# Patient Record
Sex: Male | Born: 1985 | Race: White | Hispanic: No | Smoking: Current every day smoker
Health system: Southern US, Community
[De-identification: ages and names within clinical notes are randomized; demographics above are authoritative.]

## PROBLEM LIST (undated history)

## (undated) DIAGNOSIS — I10 Essential (primary) hypertension: Secondary | ICD-10-CM

## (undated) DIAGNOSIS — B019 Varicella without complication: Secondary | ICD-10-CM

## (undated) DIAGNOSIS — K259 Gastric ulcer, unspecified as acute or chronic, without hemorrhage or perforation: Secondary | ICD-10-CM

## (undated) HISTORY — DX: Gastric ulcer, unspecified as acute or chronic, without hemorrhage or perforation: K25.9

## (undated) HISTORY — DX: Essential (primary) hypertension: I10

## (undated) HISTORY — DX: Varicella without complication: B01.9

---
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
## 2006-06-23 ENCOUNTER — Emergency Department (HOSPITAL_COMMUNITY): Admission: EM | Admit: 2006-06-23 | Discharge: 2006-06-23 | Payer: Self-pay | Admitting: Emergency Medicine

## 2006-09-01 ENCOUNTER — Emergency Department (HOSPITAL_COMMUNITY): Admission: EM | Admit: 2006-09-01 | Discharge: 2006-09-01 | Payer: Self-pay | Admitting: Emergency Medicine

## 2006-09-03 ENCOUNTER — Emergency Department: Payer: Self-pay | Admitting: Emergency Medicine

## 2006-12-04 HISTORY — PX: APPENDECTOMY: SHX54

## 2006-12-13 ENCOUNTER — Emergency Department (HOSPITAL_COMMUNITY): Admission: EM | Admit: 2006-12-13 | Discharge: 2006-12-13 | Payer: Self-pay | Admitting: Emergency Medicine

## 2007-04-07 ENCOUNTER — Emergency Department (HOSPITAL_COMMUNITY): Admission: EM | Admit: 2007-04-07 | Discharge: 2007-04-07 | Payer: Self-pay | Admitting: Emergency Medicine

## 2007-04-25 ENCOUNTER — Emergency Department (HOSPITAL_COMMUNITY): Admission: EM | Admit: 2007-04-25 | Discharge: 2007-04-25 | Payer: Self-pay | Admitting: Emergency Medicine

## 2007-04-27 ENCOUNTER — Inpatient Hospital Stay (HOSPITAL_COMMUNITY): Admission: EM | Admit: 2007-04-27 | Discharge: 2007-05-01 | Payer: Self-pay | Admitting: Emergency Medicine

## 2007-04-27 ENCOUNTER — Encounter (INDEPENDENT_AMBULATORY_CARE_PROVIDER_SITE_OTHER): Payer: Self-pay | Admitting: Surgery

## 2007-06-19 ENCOUNTER — Emergency Department (HOSPITAL_COMMUNITY): Admission: EM | Admit: 2007-06-19 | Discharge: 2007-06-19 | Payer: Self-pay | Admitting: Emergency Medicine

## 2007-06-26 ENCOUNTER — Emergency Department (HOSPITAL_COMMUNITY): Admission: EM | Admit: 2007-06-26 | Discharge: 2007-06-26 | Payer: Self-pay | Admitting: Emergency Medicine

## 2007-06-27 ENCOUNTER — Emergency Department (HOSPITAL_COMMUNITY): Admission: EM | Admit: 2007-06-27 | Discharge: 2007-06-27 | Payer: Self-pay | Admitting: Emergency Medicine

## 2007-06-30 ENCOUNTER — Emergency Department (HOSPITAL_COMMUNITY): Admission: EM | Admit: 2007-06-30 | Discharge: 2007-06-30 | Payer: Self-pay | Admitting: Emergency Medicine

## 2007-07-07 ENCOUNTER — Emergency Department (HOSPITAL_COMMUNITY): Admission: EM | Admit: 2007-07-07 | Discharge: 2007-07-07 | Payer: Self-pay | Admitting: Emergency Medicine

## 2007-07-24 ENCOUNTER — Emergency Department (HOSPITAL_COMMUNITY): Admission: EM | Admit: 2007-07-24 | Discharge: 2007-07-25 | Payer: Self-pay | Admitting: Emergency Medicine

## 2007-08-31 ENCOUNTER — Emergency Department (HOSPITAL_COMMUNITY): Admission: EM | Admit: 2007-08-31 | Discharge: 2007-08-31 | Payer: Self-pay | Admitting: Emergency Medicine

## 2007-10-02 ENCOUNTER — Emergency Department (HOSPITAL_COMMUNITY): Admission: EM | Admit: 2007-10-02 | Discharge: 2007-10-02 | Payer: Self-pay | Admitting: Emergency Medicine

## 2007-12-05 HISTORY — PX: WISDOM TOOTH EXTRACTION: SHX21

## 2007-12-19 ENCOUNTER — Emergency Department (HOSPITAL_COMMUNITY): Admission: EM | Admit: 2007-12-19 | Discharge: 2007-12-19 | Payer: Self-pay | Admitting: Emergency Medicine

## 2008-02-16 ENCOUNTER — Emergency Department (HOSPITAL_COMMUNITY): Admission: EM | Admit: 2008-02-16 | Discharge: 2008-02-16 | Payer: Self-pay | Admitting: Emergency Medicine

## 2008-05-31 ENCOUNTER — Emergency Department (HOSPITAL_COMMUNITY): Admission: EM | Admit: 2008-05-31 | Discharge: 2008-05-31 | Payer: Self-pay | Admitting: Emergency Medicine

## 2008-06-07 ENCOUNTER — Emergency Department (HOSPITAL_COMMUNITY): Admission: EM | Admit: 2008-06-07 | Discharge: 2008-06-07 | Payer: Self-pay | Admitting: Emergency Medicine

## 2008-07-08 ENCOUNTER — Emergency Department (HOSPITAL_BASED_OUTPATIENT_CLINIC_OR_DEPARTMENT_OTHER): Admission: EM | Admit: 2008-07-08 | Discharge: 2008-07-08 | Payer: Self-pay | Admitting: Emergency Medicine

## 2008-07-12 ENCOUNTER — Emergency Department (HOSPITAL_BASED_OUTPATIENT_CLINIC_OR_DEPARTMENT_OTHER): Admission: EM | Admit: 2008-07-12 | Discharge: 2008-07-12 | Payer: Self-pay | Admitting: Emergency Medicine

## 2008-07-21 ENCOUNTER — Emergency Department (HOSPITAL_BASED_OUTPATIENT_CLINIC_OR_DEPARTMENT_OTHER): Admission: EM | Admit: 2008-07-21 | Discharge: 2008-07-21 | Payer: Self-pay | Admitting: Emergency Medicine

## 2008-07-26 ENCOUNTER — Emergency Department (HOSPITAL_BASED_OUTPATIENT_CLINIC_OR_DEPARTMENT_OTHER): Admission: EM | Admit: 2008-07-26 | Discharge: 2008-07-26 | Payer: Self-pay | Admitting: Emergency Medicine

## 2008-07-29 ENCOUNTER — Emergency Department (HOSPITAL_BASED_OUTPATIENT_CLINIC_OR_DEPARTMENT_OTHER): Admission: EM | Admit: 2008-07-29 | Discharge: 2008-07-29 | Payer: Self-pay | Admitting: Emergency Medicine

## 2008-08-03 ENCOUNTER — Emergency Department (HOSPITAL_COMMUNITY): Admission: EM | Admit: 2008-08-03 | Discharge: 2008-08-03 | Payer: Self-pay | Admitting: Emergency Medicine

## 2008-10-11 ENCOUNTER — Emergency Department (HOSPITAL_BASED_OUTPATIENT_CLINIC_OR_DEPARTMENT_OTHER): Admission: EM | Admit: 2008-10-11 | Discharge: 2008-10-11 | Payer: Self-pay | Admitting: Emergency Medicine

## 2008-11-22 ENCOUNTER — Other Ambulatory Visit: Payer: Self-pay

## 2008-11-23 ENCOUNTER — Observation Stay (HOSPITAL_COMMUNITY): Admission: AD | Admit: 2008-11-23 | Discharge: 2008-11-23 | Payer: Self-pay | Admitting: Psychiatry

## 2008-11-23 ENCOUNTER — Ambulatory Visit: Payer: Self-pay | Admitting: Psychiatry

## 2009-03-20 ENCOUNTER — Emergency Department (HOSPITAL_COMMUNITY): Admission: EM | Admit: 2009-03-20 | Discharge: 2009-03-20 | Payer: Self-pay | Admitting: Emergency Medicine

## 2009-04-29 ENCOUNTER — Emergency Department (HOSPITAL_COMMUNITY): Admission: EM | Admit: 2009-04-29 | Discharge: 2009-04-29 | Payer: Self-pay | Admitting: Emergency Medicine

## 2009-06-13 ENCOUNTER — Emergency Department (HOSPITAL_COMMUNITY): Admission: EM | Admit: 2009-06-13 | Discharge: 2009-06-13 | Payer: Self-pay | Admitting: Emergency Medicine

## 2009-06-22 ENCOUNTER — Emergency Department (HOSPITAL_COMMUNITY): Admission: EM | Admit: 2009-06-22 | Discharge: 2009-06-22 | Payer: Self-pay | Admitting: Emergency Medicine

## 2009-06-29 ENCOUNTER — Emergency Department (HOSPITAL_COMMUNITY): Admission: EM | Admit: 2009-06-29 | Discharge: 2009-06-29 | Payer: Self-pay | Admitting: Emergency Medicine

## 2009-07-09 ENCOUNTER — Emergency Department (HOSPITAL_BASED_OUTPATIENT_CLINIC_OR_DEPARTMENT_OTHER): Admission: EM | Admit: 2009-07-09 | Discharge: 2009-07-09 | Payer: Self-pay | Admitting: Emergency Medicine

## 2009-07-11 ENCOUNTER — Emergency Department (HOSPITAL_COMMUNITY): Admission: EM | Admit: 2009-07-11 | Discharge: 2009-07-11 | Payer: Self-pay | Admitting: Emergency Medicine

## 2009-07-19 ENCOUNTER — Emergency Department (HOSPITAL_BASED_OUTPATIENT_CLINIC_OR_DEPARTMENT_OTHER): Admission: EM | Admit: 2009-07-19 | Discharge: 2009-07-19 | Payer: Self-pay | Admitting: Emergency Medicine

## 2009-09-15 ENCOUNTER — Emergency Department (HOSPITAL_COMMUNITY): Admission: EM | Admit: 2009-09-15 | Discharge: 2009-09-15 | Payer: Self-pay | Admitting: Emergency Medicine

## 2009-09-22 ENCOUNTER — Emergency Department (HOSPITAL_COMMUNITY): Admission: EM | Admit: 2009-09-22 | Discharge: 2009-09-23 | Payer: Self-pay | Admitting: Emergency Medicine

## 2009-09-27 ENCOUNTER — Emergency Department (HOSPITAL_COMMUNITY): Admission: EM | Admit: 2009-09-27 | Discharge: 2009-09-27 | Payer: Self-pay | Admitting: Emergency Medicine

## 2009-10-02 ENCOUNTER — Inpatient Hospital Stay (HOSPITAL_COMMUNITY): Admission: EM | Admit: 2009-10-02 | Discharge: 2009-10-03 | Payer: Self-pay

## 2009-10-02 ENCOUNTER — Encounter: Payer: Self-pay | Admitting: Emergency Medicine

## 2010-12-25 ENCOUNTER — Encounter: Payer: Self-pay | Admitting: Emergency Medicine
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
## 2011-03-09 LAB — URINALYSIS, ROUTINE W REFLEX MICROSCOPIC
Glucose, UA: NEGATIVE mg/dL
Hgb urine dipstick: NEGATIVE
Protein, ur: NEGATIVE mg/dL
Specific Gravity, Urine: 1.018 (ref 1.005–1.030)
Specific Gravity, Urine: 1.01 (ref 1.005–1.030)
Urobilinogen, UA: 0.2 mg/dL (ref 0.0–1.0)
pH: 6.5 (ref 5.0–8.0)

## 2011-03-09 LAB — POCT I-STAT, CHEM 8
Chloride: 105 mEq/L (ref 96–112)
Sodium: 143 mEq/L (ref 135–145)

## 2011-03-09 LAB — CBC
HCT: 44.4 % (ref 39.0–52.0)
HCT: 42.9 % (ref 39.0–52.0)
Hemoglobin: 15.1 g/dL (ref 13.0–17.0)
MCHC: 34.1 g/dL (ref 30.0–36.0)
MCHC: 34.2 g/dL (ref 30.0–36.0)
MCV: 96.2 fL (ref 78.0–100.0)
Platelets: 237 10*3/uL (ref 150–400)
Platelets: 201 10*3/uL (ref 150–400)
RBC: 4.62 MIL/uL (ref 4.22–5.81)
RBC: 4.49 MIL/uL (ref 4.22–5.81)
RDW: 13.9 % (ref 11.5–15.5)
WBC: 6.8 10*3/uL (ref 4.0–10.5)
WBC: 10.4 10*3/uL (ref 4.0–10.5)

## 2011-03-09 LAB — COMPREHENSIVE METABOLIC PANEL
AST: 23 U/L (ref 0–37)
Albumin: 4.2 g/dL (ref 3.5–5.2)
Calcium: 9.4 mg/dL (ref 8.4–10.5)
Creatinine, Ser: 0.71 mg/dL (ref 0.4–1.5)
Total Protein: 7.2 g/dL (ref 6.0–8.3)

## 2011-03-09 LAB — DIFFERENTIAL
Basophils Absolute: 0.1 10*3/uL (ref 0.0–0.1)
Basophils Absolute: 0 10*3/uL (ref 0.0–0.1)
Eosinophils Absolute: 0 10*3/uL (ref 0.0–0.7)
Eosinophils Relative: 0 % (ref 0–5)
Lymphocytes Relative: 28 % (ref 12–46)
Lymphocytes Relative: 14 % (ref 12–46)
Lymphs Abs: 2.9 10*3/uL (ref 0.7–4.0)
Monocytes Absolute: 0.8 10*3/uL (ref 0.1–1.0)
Monocytes Absolute: 0.7 10*3/uL (ref 0.1–1.0)
Neutrophils Relative %: 63 % (ref 43–77)

## 2011-03-09 LAB — BASIC METABOLIC PANEL
BUN: 6 mg/dL (ref 6–23)
CO2: 26 mEq/L (ref 19–32)
Creatinine, Ser: 0.76 mg/dL (ref 0.4–1.5)
GFR calc Af Amer: >60 mL/min (ref >60)
GFR calc Af Amer: >60 mL/min (ref >60)
GFR calc non Af Amer: >60 mL/min (ref >60)
Glucose, Bld: 109 mg/dL — ABNORMAL HIGH (ref 70–99)
Potassium: 3.1 mEq/L — ABNORMAL LOW (ref 3.5–5.1)

## 2011-03-09 LAB — GLUCOSE, CAPILLARY

## 2011-03-09 LAB — RAPID URINE DRUG SCREEN, HOSP PERFORMED
Amphetamines: NOT DETECTED
Benzodiazepines: NOT DETECTED
Cocaine: POSITIVE — AB
Opiates: NOT DETECTED
Tetrahydrocannabinol: POSITIVE — AB

## 2011-04-18 NOTE — Op Note (Signed)
NAMEZACH, Tracy York                ACCOUNT NO.:  [id number]   MEDICAL RECORD NO.:  [id number]          PATIENT TYPE:  INP   LOCATION:  0098                         FACILITY:  [facility] [place]   PHYSICIAN:  Wilmon Arms. Corliss Skains, M.D. DATE OF BIRTH:  03/17/1986   DATE OF PROCEDURE:  04/27/2007  DATE OF DISCHARGE:                               OPERATIVE REPORT   PREOPERATIVE DIAGNOSIS:  Acute appendicitis.   POSTOPERATIVE DIAGNOSIS:  Acute perforated appendicitis.   PROCEDURE PERFORMED:  Laparoscopy converted to exploratory laparotomy  with open appendectomy.   SURGEON:  Dr. Corliss Skains   ANESTHESIA:  General endotracheal.   INDICATIONS:  The patient is a 25 year old male who presents with a 2  day history of right lower quadrant pain.  He presented to the emergency  department for evaluation initially on Apr 25, 2007, but signed out AMA.  He returned on May 23 when he was feeling worse.  He was worked up by  the emergency department physicians and was noted to have acute  appendicitis with possible perforation and a white count of 29.   DESCRIPTION OF PROCEDURE:  The patient was brought to the operating room  and placed in a supine position on the operating room table with his  left arm tucked.  After an adequate level of general anesthesia was  obtained, a Foley catheter was placed under sterile technique.  The  patient's abdomen was shaved, prepped with Betadine, and draped in a  sterile fashion.  Time-out was taken to assure the proper patient and  proper procedure.  A vertical incision was made just below the  umbilicus.  Dissection was carried down to the fascia which was opened  vertically.  The peritoneum was bluntly entered with a Kelly clamp.  A  stay suture of 0 Vicryl was placed around the fascial opening.  The  Hasson cannula was inserted and secured with a stay suture.  Pneumoperitoneum was obtained by insufflating CO2, maintaining a maximum  pressure of 15 mmHg.  The laparoscope was  inserted.  We were able to  visualize purulent fluid in the right pericolic gutter all the way up  over the edge of the liver.  The omentum was densely adherent to the  anterior abdominal wall and the pelvis in the right lower quadrant.  A 5  mm port was placed in the right upper quadrant, and another 5 mm port  was placed in the left lower quadrant.  The purulent fluid was suctioned  out of the right pericolic gutter.  This area was thoroughly irrigated.  Blunt dissection was used to dissect the omentum away from the anterior  abdominal wall.  The cecum was identified and appeared relatively  normal.  The small bowel was densely adherent down to an inflammatory  process in the pelvis.  We were unable to identify the pelvis in the  right lower quadrant.  It appeared to be extended down into the pelvis.  The decision was then made to convert an open procedure as most of the  inflammation seemed to be in the pelvis.  The trocars were removed,  and  the infraumbilical incision was extended from the umbilicus to just  above the symphysis pubis.  Dissection was carried down on the fascia  which was opened with cautery.  The Balfour retractor was inserted.  A  large amount of purulent fluid was suctioned out of the pelvis.  Blunt  dissection was used to mobilize a very large dilated inflamed perforated  appendix.  There was some stool spillage noted in the pelvis.  This area  was thoroughly irrigated.  The mesoappendix was divided between [place]  clamps back to the base of the appendix.  A hard fecalith was palpated  in the base of the appendix.  Once we had mobilized the appendix back to  its junction with the cecum, this was divided with a GIA stapler.  The  base of the appendix appeared healthy with no apparent inflammation.  The stump was then inverted with 2-0 silk sutures.  The appendix was  passed off the field and sent for pathologic examination.  We then  thoroughly irrigated the entire  abdomen with 5 liters of warm saline.  A  19 Blake drain was brought in through the left lower quadrant port site.  This was placed down into the pelvis with the tip heading back up into  the right lower quadrant.  The omentum was placed back over the small  bowel.  The fascia was closed with #1 PDS.  Staples were used to close  the skin after thoroughly irrigating the subcutaneous tissues.  Clean  dressing was applied.  The patient was extubated and brought to the  recovery room in stable condition.  All sponge, needle, and instrument  counts were correct.      Wilmon Arms. Tsuei, M.D.  Electronically Signed     MKT/MEDQ  D:  04/27/2007  T:  04/27/2007  Job:  [id number]
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
## 2011-04-18 NOTE — H&P (Signed)
Tracy York, Tracy York                ACCOUNT NO.:  [id number]   MEDICAL RECORD NO.:  [id number]          PATIENT TYPE:  INP   LOCATION:  0098                         FACILITY:  [facility]   PHYSICIAN:  Wilmon Arms. Corliss Skains, M.D. DATE OF BIRTH:  12-18-1985   DATE OF ADMISSION:  04/26/2007  DATE OF DISCHARGE:                              HISTORY & PHYSICAL   CHIEF COMPLAINT:  Abdominal pain.   HISTORY OF PRESENT ILLNESS:  The patient is a 25 year old male in  reasonably good health who presents with 2-day history of periumbilical  pain which has now migrated to his right lower quadrant but has now also  spread diffusely across the lower abdomen.  He has had no appetite.  He  does report some nausea and vomiting.  He has also had some diarrhea.  He was initially seen in the emergency department on the evening of May  22 but signed out against medical advice because he was tired of  waiting.  Throughout the day on May 23, he began to feel worse.  He was  unable to eat, and so he called EMS for a trip back to the emergency  department.  This time, he stayed for his entire workup which included a  CT scan.   PAST MEDICAL HISTORY:  Peptic ulcer disease.   PAST SURGICAL HISTORY:  None.   FAMILY HISTORY:  Coronary artery disease, diabetes, hypertension,  stroke.   SOCIAL HISTORY:  The patient smokes a half-a-pack a day and drinks  minimal EtOH.  Denies any other drug use.   ALLERGIES:  None.   MEDICATIONS:  None.   PHYSICAL EXAMINATION:  Temperature 99.1, pulse 98, respirations 18,  blood pressure 120/72.  This is a thin male in no apparent distress.  HEENT:  EOMI.  Sclerae are anicteric.  NECK:  No mass, no thyromegaly.  LUNGS:  Clear.  Normal respiratory effort.  HEART:  Regular rate and rhythm.  No murmur.  ABDOMEN:  Is quiet nondistended.  Very tender to palpation across the  lower abdomen, greatest in the right lower quadrant and in the  suprapubic region.  There is some rebound and  guarding.   LABORATORY DATA:  White count 29.8 (on May 22, his white count was 19),  hemoglobin 13.3.  Sodium 132, potassium 3.2.   CT scan shows an enlarged inflamed appendix with large amount of right  pericolic gutter ascites as well as fluid in the pelvis.  There is no  obvious abscess.  No free air.  There is also secondary thickening of  the adjacent small-bowel.   IMPRESSION:  Acute appendicitis with possible and probable perforation.   PLAN:  Recommend beginning with a laparoscopic appendectomy with  possible conversion to an open procedure if he has too much  contamination.  The patient understands and wishes to proceed.      Wilmon Arms. Tsuei, M.D.  Electronically Signed     MKT/MEDQ  D:  04/27/2007  T:  04/27/2007  Job:  [id number]
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
## 2011-04-21 NOTE — Discharge Summary (Signed)
NAMETOREN, TUCHOLSKI                ACCOUNT NO.:  [id number]   MEDICAL RECORD NO.:  [id number]          PATIENT TYPE:  IPS   LOCATION:  0505                          FACILITY:  [facility]   PHYSICIAN:  Geoffery Lyons, M.D.      DATE OF BIRTH:  1985/12/30   DATE OF ADMISSION:  11/23/2008  DATE OF DISCHARGE:  11/23/2008                               DISCHARGE SUMMARY   CHIEF COMPLAINT/HISTORY OF PRESENT ILLNESS:  This was the first  admission to [facility] for this 25 year old male that  came as he said he was upset, wife left with his 45-month-old baby.  Endorsed decreased sleep, cannot eat, agitated, irritable, sad.  Claimed  he had been diagnosed with bipolar disorder.  Had been on Celexa,  Geodon, Depakote, Seroquel, Zyprexa and Neurontin.  Endorsed he called  [facility] to get medications, but he could not get to see the  physician until February or March.  Reports having an 9-month-old  daughter.  He lives with his girlfriend.  Does not does know his mother.  Does not get along with his father.  He does not drink often, but when  he drinks, he drinks excessively.  He had drank 3 gallons over a 3 day  period.  Endorsed being addicted to opiates.  He denied suicidal  ideations.  Endorsed he only wanted to get medications in place.  He had  a court date in [facility] that he did not want to miss for what he  requests discharge.  He was in full contact with reality.  Denied any  active suicide or homicide ideas, no evidence of hallucinations or  delusions.  Wanting to be discharged so he could go to court.  Willing  to now pursue outpatient treatment.  Was encouraged by the fact that he  was already started on medication.   DISCHARGE DIAGNOSES:  AXIS I:  Alcohol abuse, mood disorder, not  otherwise specified.  AXIS II:  No diagnosis.  AXIS III:  No diagnosis.  AXIS IV: Moderate.  AXIS V:  Upon discharge 50.   DISCHARGE MEDICATIONS:  1. Neurontin 100 mg 2-3 times a  day.  2. Seroquel 200 mg at bedtime.  3. Librium taper 25 mg 3 times a day for a day; then 25 mg twice a day      for a day; then 25 mg daily for 1 day and then discontinue.   FOLLOW UP:  Follow up at [facility].      Geoffery Lyons, M.D.  Electronically Signed     IL/MEDQ  D:  12/22/2008  T:  12/22/2008  Job:  [id number]

## 2011-04-21 NOTE — Discharge Summary (Signed)
[facility]  Patient:    Tracy York, Tracy York                         MRN: [id number] Adm. Date:  [id number] Disc. Date: [id number] Attending:  Veneta Penton                           Discharge Summary  REASON FOR ADMISSION:  This 25 year old white male was readmitted approximately 24 hours after discharge from the Behavioral Medicine Unit after he went home and took a drug overdose as a suicide attempt.  For further history of present illness, please see the patients psychiatric admission assessment on this admission and his prior admission as well the discharge summary.  LABORATORY EXAMINATION:  A laboratory evaluation was done at [facility] in stabilizing the patient and no further laboratory was done while at the [facility].  HOSPITAL COURSE:  The patient rapidly adapted to unit routine, socializing well with both patients and staff.  He was able to discuss his reasons for overdose which included the fact that he felt his grandparents were pressuring him immediately upon returning home as well his father and sisters.  He felt overwhelmed at coming home and felt as if no one cared about him.  He stated that once he was on the Pediatrics Unit recovering from his overdose and saw his family members having come to his bedside, he realized that people did care about him and no longer as if his condition was hopeless.  We discussed in therapy the fact that his family might not always be there for him or be there when he wanted them to be, that he would have to be able to deal with his stresses on his own at times.  He was able to begin to deal with his problems with impulse control and his anger in psychotherapy and at the time of discharge, he denies homicidal or suicidal ideation.  He has been restarted on Celexa at 20 mg p.o. q.d. and has tolerated this medication well without side effects.  He is participating in all aspects  of the therapeutic treatment program and consequently, it is felt that the patient has reached his maximum benefits of hospitalization and is ready for discharge to a less restrictive alternative setting.  CONDITION ON DISCHARGE:  Improved.  DIAGNOSES: Axis I:    1. Major depression, recurrent type, severe without psychosis.            2. Probable conduct disorder.            3. Nicotine dependence.            4. Alcohol and cannabis abuse. Axis II:   None. Axis III:  None. Axis IV:   Severe. Axis V:    20 on admission, 30 on discharge.  FURTHER EVALUATION AND TREATMENT RECOMMENDATIONS: 1. The patient is discharged to home. 2. The patient is discharged on Celexa 20 mg p.o. q.d. 3. The patient is discharged on unrestricted level of activity and a regular    diet. 4. The patient will follow up with Dr. Wynonia Lawman at [facility] along with Loma Messing, his outpatient individual and    family therapist for all further aspects of his mental health care and    consequently I will sign off on the case at this time. DD:  10/08/00 TD:  10/08/00 Job: [id number]
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
## 2011-04-21 NOTE — Discharge Summary (Signed)
NAME:  Tracy York, Tracy York                ACCOUNT NO.:  [id number]   MEDICAL RECORD NO.:  [id number]          PATIENT TYPE:  INP   LOCATION:  1526                         FACILITY:  [facility]   PHYSICIAN:  Wilmon Arms. Corliss Skains, M.D. DATE OF BIRTH:  Jan 16, 1986   DATE OF ADMISSION:  04/27/2007  DATE OF DISCHARGE:  05/01/2007                               DISCHARGE SUMMARY   ADMISSION DIAGNOSIS:  Acute appendicitis.   DISCHARGE DIAGNOSIS:  Acute perforated appendicitis.   PROCEDURE:  Laparoscopy converted to exploratory laparotomy with open  appendectomy.  Surgeon - Wilmon Arms. Tsuei, M.D.   BRIEF HISTORY:  The patient is a 25 year old male who presents with a 2-  day history of periumbilical pain localized to the right lower quadrant.  He initially was seen on Apr 25, 2007 in the emergency department but  left AMA before his workup was complete.  He came back down the next  night with diffuse pain across his lower abdomen.  His white count was  noted to be elevated at 29.8.  A CT scan showed  signs of acute  appendicitis with free fluid in the pelvis.   HOSPITAL COURSE:  The patient was brought to the operating room and a  laparoscopic was inserted.  Gross purulence was noted throughout the  patient's entire abdomen, even up over the liver.  This purulence was  thoroughly suction and irrigated.  The decision was then made to convert  to open procedure due to adherent small bowel into the right lower  quadrant.  We were able to identify the large dilated perforated  appendix.  This was divided with a GIA stapler at its base.  The  surrounding small bowel seemed to be inflamed, but this seems to be  secondary to be perforated appendix.  The pelvis was thoroughly  irrigated with saline.  A 19 Blake drain was placed in the pelvis.  The  patient's fascia was closed and staples were loosely placed.  The  patient has significant postoperative ileus.  He has some noncompliance,  as the patient started  smoking in his hospital bathroom despite  instructions against this.  He had a superficial wound infection which  is not unexpected.  His ileus resolved by postoperative day #4.  He is  being discharged home on May 01, 2007 and will follow-up on May 03, 2007  for staple removal.  He is given Vicodin p.r.n. for pain.  He is also  given Augmentin for another week.      Wilmon Arms. Tsuei, M.D.  Electronically Signed     MKT/MEDQ  D:  05/22/2007  T:  05/22/2007  Job:  [id number]

## 2011-04-21 NOTE — Discharge Summary (Signed)
NAME:  Tracy York, Tracy York                          ACCOUNT NO.:  [id number]   MEDICAL RECORD NO.:  [id number]                   PATIENT TYPE:  INP   LOCATION:  0200                                 FACILITY:  [facility]   PHYSICIAN:  Cindie Crumbly, M.D.               DATE OF BIRTH:  Apr 26, 1986   DATE OF ADMISSION:  05/13/2003  DATE OF DISCHARGE:  05/18/2003                                 DISCHARGE SUMMARY   REASON FOR ADMISSION:  This 25 year old white male was involuntarily  admitted complaining of depression, status post overdose as a possible  suicide attempt.  For further history of present illness, please see the  patient's psychiatric admission assessment.   PHYSICAL EXAMINATION:  At the time of admission was entirely unremarkable.   LABORATORY EXAMINATION:  The patient underwent laboratory workup to rule out  any other medical problems contributing to his symptomatology.  This  included a T4, TSH, CBC with differential, hepatic panel, GGT, UA, RPR,  urine probe for gonorrhea and Chlamydia, all of which are pending at the  time of discharge.  The patient received no x-rays, no special procedures,  no additional consultations.  He sustained no complications during the  course of this hospitalization.   HOSPITAL COURSE:  On admission, the patient was psychomotor agitated.  Affect and mood were depressed, irritable, and angry.  He reported that he  was no longer feeling suicidal and did not remember all of what had happened  to him stating that he was intoxicated at the time of this admission.  He  rapidly adapted to unit routine, socializing well with patients and staff,  slowly began to address his chemical dependency issues.  He was begun on a  trial of Prozac in an attempt to improve his depressive symptomatology.  He  tolerated this well without side effects.  At the time of discharge, he  denies any homicidal or suicidal ideation.  His affect and mood have  improved.  His  concentration has increased.  He is motivated for outpatient  therapy and consequently is felt to have reached his maximum benefits of  hospitalization and is ready for discharge to a less restrictive alternative  setting.  His condition on discharge is improved.   DIAGNOSES ACCORDING TO DSM-IV:   AXIS I:  1. Major depression, recurrent, severe, without psychosis.  2. Conduct disorder.  3. Polysubstance dependence.  4. Rule out substance induced mood disorder.   AXIS II:  1. Rule out learning disorder, not otherwise specified.  2. Rule out personality disorder, not otherwise specified.   AXIS III:  Gastroesophageal reflux disease by history.   AXIS IV:  Current psychosocial stressors are severe.   AXIS V:  20 on admission; 30 on discharge.   FURTHER EVALUATION AND TREATMENT RECOMMENDATIONS:  The patient is discharged  to home.  He is discharged on an unrestricted level of activity and a  regular  diet.  He is discharged on Prozac 20 mg p.o. q.d.  He will follow up  with his outpatient psychiatrist for all further aspects of his psychiatric  care and with his primary care physician for all further aspects of his  medical care and therefore I will sign off on the case at this time.                                               Cindie Crumbly, M.D.    TS/MEDQ  D:  05/20/2003  T:  05/20/2003  Job:  [id number]

## 2011-04-21 NOTE — H&P (Signed)
[facility]  Patient:    Tracy York, Tracy York                         MRN: [id number] Adm. Date:  [id number] Attending:  Veneta Penton                   Psychiatric Admission Assessment  DATE OF ADMISSION:  October 02, 2000  REASON FOR ADMISSION:  This 25 year old white male was readmitted approximately 24 hours after discharge.  He was discharged from the [facility] and went home with his grandparents.  He stated that once he got home they "starting picking on me" and telling him that he had chores and responsibilities that he had to go back to doing immediately.  He stated that he wanted some time to "settle in" after leaving the hospital and got into an argument with his grandfather about doing his chores at the time that he came home.  The patient reports that in anger his grandfather told him that if Raja was not going to do what he was required to do at home that grandfather did not care about him anymore and he could do whatever he wanted to do.  The patient became upset by this.  He went down to his sisters home where grandparents report that his sisters teased him about him potentially harming himself and the patient stated that they told him that if he was going to harm himself he might as well go ahead and do it.  He reported that he wanted to die, began cutting on his arm "but that did not work," so he took as Market researcher as he had available to him which was approximately 30 tablets.  He was then transported to [facility] where he was stabilized on the pediatric unit before being transferred to this facility for more definitive inpatient stabilization.  The patients past psychiatric history, drug and alcohol abuse history, past medical history, are unchanged over previous.  ALLERGIES:  He has no known drug allergies or sensitivities.  CURRENT MEDICATIONS:  Continues to include Celexa 20 mg p.o.  q.d.Marland Kitchen  FAMILY AND SOCIAL HISTORY:  Unchanged other than that noted above.  STRENGTHS AND ASSETS:  That he is an intelligent individual.  MENTAL STATUS EXAMINATION:  The patient presents as a well-developed, well-nourished, psychomotor agitated adolescent white male, who is alert and oriented x 4.  His speech has decreased rate in volume and speech, increased speech latency.  He displays no looseness of association or phonemic errors, or evidence of a thought disorder.  His affect and mood are depressed, irritable and angry.  He displays poor impulse control.  Concentration is decreased.  He is disheveled, unkempt with poor hygiene.  His immediate recall, short-term memory and remote memory are intact.  His thought processes are goal directed.  ADMISSION DIAGNOSES:  His diagnoses according to DSM-IV: Axis I:    1. Major depression, recurrent type, severe without               psychosis.            2. Rule out conduct disorder. Axis II:   None. Axis III:  None. Axis IV:   Severe. Axis V:    Code 20.  ESTIMATED LENGTH OF STAY:  The estimated length of stay for the patient on the inpatient unit is five days.  INITIALLY DISCHARGE PLAN:  To discharge the patient back to  home.  INITIAL PLAN OF CARE:  To restart the patient on Celexa.  Psychotherapy will focus on decreasing the patients potential for self-harm, increasing activities of daily living, decreasing cognitive distortions and addressing his current family problems. DD:  10/03/00 TD:  10/03/00 Job: [id number]

## 2011-04-21 NOTE — Discharge Summary (Signed)
[facility]  Patient:    JDYN, PARKERSON                   MRN: [id number] Adm. Date:  [id number] Disc. Date: [id number] Attending:  Veneta Penton                           Discharge Summary  REASON FOR ADMISSION:  This 25 year old white male was admitted status post overdose with aspirin as a suicide attempt.  For further history of present illness, please see the patients psychiatric admission assessment.  PHYSICAL EXAMINATION:  Significant for obesity and was otherwise unremarkable.  LABORATORY EXAMINATION:  The patient underwent a laboratory work-up to rule out any medical problems contributing to his symptomatology.  This was done at the main hospital at [facility].  No additional laboratory workup was performed at this facility.  Patient received no x-rays, no special procedures, no additional consultations.  He sustained no complications during the course of this hospitalization.  HOSPITAL COURSE:  Patient rapidly adapted to unit routine, socializing well with both patients and staff.  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  In therapy on the unit, he has been superficial but has denied any homicidal or suicidal ideation.  He has been restarted on psychotropic medications including Celexa and has done well.  Because of significant weight gain, he was changed from Zyprexa to Geodon, has tolerated that medication well without side effects. He no longer complains of any agitation or disorganized thoughts and has displayed no evidence of psychosis since being switched to that medicine.  On discharge, the patient is actively participating in all aspects of the therapeutic treatment program.  He reports being motivated for  outpatient therapy but he remains somewhat superficial in the issues that he has been addressing and he continues to be well focused in psychotherapy, and consequently is felt to have reached the maximum benefits of hospitalization and is ready for discharge to a less restricted alternative setting.  CONDITION ON DISCHARGE:  Improved  FINAL DIAGNOSIS: Axis I:    1. Major depression, recurrent type, severe, with mood congruent               psychosis.            2. Conduct disorder.            3. Polysubstance dependence. Axis II:   Rule out personality disorder not otherwise specified. Axis III:  Overweight, allergic rhinitis. Axis IV:   Current psychosocial stressors are severe. Axis V:    Code 20 on admission, code 30 on discharge.  FURTHER EVALUATION AND TREATMENT RECOMMENDATIONS: 1. The patient is discharged to home. 2. He is discharged on an unrestricted level of activity and a regular diet. 3. He will follow up with Dr. Wynonia Lawman at [facility]    for all further aspects of his psychiatric care, and consequently I will    sign off on  he case at this time.  DISCHARGE MEDICATIONS: 1. Geodon 40 mg p.o. q.h.s. 2. Celexa 40 mg p.o. q.d. DD:  04/01/01 TD:  04/01/01 Job: [id number]

## 2011-08-23 ENCOUNTER — Emergency Department (HOSPITAL_BASED_OUTPATIENT_CLINIC_OR_DEPARTMENT_OTHER)
Admission: EM | Admit: 2011-08-23 | Discharge: 2011-08-23 | Disposition: A | Discharge status: Home / Self Care | Payer: Self-pay | Attending: Emergency Medicine | Admitting: Emergency Medicine

## 2011-08-23 ENCOUNTER — Encounter: Payer: Self-pay | Admitting: *Deleted

## 2011-08-23 DIAGNOSIS — K0889 Other specified disorders of teeth and supporting structures: Secondary | ICD-10-CM

## 2011-08-23 DIAGNOSIS — K029 Dental caries, unspecified: Secondary | ICD-10-CM | POA: Insufficient documentation

## 2011-08-23 DIAGNOSIS — F17200 Nicotine dependence, unspecified, uncomplicated: Secondary | ICD-10-CM | POA: Insufficient documentation

## 2011-08-23 DIAGNOSIS — K089 Disorder of teeth and supporting structures, unspecified: Secondary | ICD-10-CM | POA: Insufficient documentation

## 2011-08-23 MED ORDER — TRAMADOL HCL 50 MG PO TABS: 50.0000 mg | ORAL_TABLET | Freq: Four times a day (QID) | ORAL | Status: AC | PRN

## 2011-08-23 MED ORDER — PENICILLIN V POTASSIUM 250 MG PO TABS: 250.0000 mg | ORAL_TABLET | Freq: Four times a day (QID) | ORAL | Status: AC

## 2011-08-23 NOTE — ED Provider Notes (Signed)
Medical screening examination/treatment/procedure(s) were performed by non-physician practitioner and as supervising physician I was immediately available for consultation/collaboration.   Charles B. Bernette Mayers, MD 08/23/11 1946

## 2011-08-23 NOTE — ED Notes (Signed)
NP at bedside.

## 2011-08-23 NOTE — ED Notes (Signed)
Pt states has toothache, seen my [facility] for same x 1 week ago pt states pain is worse.

## 2011-08-23 NOTE — ED Provider Notes (Signed)
History     CSN: [id number] Arrival date & time: 08/23/2011  7:18 PM   Chief Complaint  Patient presents with  . Dental Pain     (Include location/radiation/quality/duration/timing/severity/associated sxs/prior treatment) HPI Comments: Pt states that he was chewing gum and he felt his filling come out:pt states that he was treated in the er and it started to get better, but he is out of medication and the symptoms restarted  Patient is a 25 y.o. male presenting with tooth pain. The history is provided by the patient. No language interpreter was used.  Dental PainThe primary symptoms include mouth pain. Primary symptoms do not include fever. The symptoms began more than 1 week ago. The symptoms are unchanged. The symptoms are new.     History reviewed. No pertinent past medical history.   Past Surgical History  Procedure Date  . Appendectomy     History reviewed. No pertinent family history.  History  Substance Use Topics  . Smoking status: Current Everyday Smoker -- 1.0 packs/day  . Smokeless tobacco: Not on file  . Alcohol Use: No      Review of Systems  Constitutional: Negative for fever.  Respiratory: Negative.   Cardiovascular: Negative.     Allergies  Review of patient's allergies indicates no known allergies.  Home Medications  No current outpatient prescriptions on file.  Physical Exam    BP 141/69  Pulse 62  Temp(Src) 98.2 F (36.8 C) (Oral)  Resp 16  Ht 5\' 10"  (1.778 m)  Wt 185 lb (83.915 kg)  BMI 26.54 kg/m2  SpO2 100%  Physical Exam  Nursing note and vitals reviewed. Constitutional: He appears well-developed and well-nourished.  HENT:  Head: Normocephalic and atraumatic.       Pt has decayed left lower teeth without sign of abscess noted  Cardiovascular: Normal rate and regular rhythm.   Pulmonary/Chest: Effort normal and breath sounds normal.  Musculoskeletal: Normal range of motion.  Neurological: He is alert.    ED Course    Procedures    No diagnosis found.   MDM Will treat for toothache       Teressa Lower, NP 08/23/11 1942

## 2011-09-08 LAB — DIFFERENTIAL
Basophils Relative: 1 % (ref 0–1)
Eosinophils Absolute: 0.2 10*3/uL (ref 0.0–0.7)
Monocytes Absolute: 1 10*3/uL (ref 0.1–1.0)
Monocytes Relative: 11 % (ref 3–12)

## 2011-09-08 LAB — BASIC METABOLIC PANEL
CO2: 27 mEq/L (ref 19–32)
Chloride: 103 mEq/L (ref 96–112)
GFR calc Af Amer: >60 mL/min (ref >60)
Sodium: 139 mEq/L (ref 135–145)

## 2011-09-08 LAB — RAPID URINE DRUG SCREEN, HOSP PERFORMED
Barbiturates: NOT DETECTED
Benzodiazepines: NOT DETECTED

## 2011-09-08 LAB — CBC
Hemoglobin: 14.5 g/dL (ref 13.0–17.0)
MCHC: 33.2 g/dL (ref 30.0–36.0)
MCV: 96.4 fL (ref 78.0–100.0)
RBC: 4.54 MIL/uL (ref 4.22–5.81)

## 2011-09-18 ENCOUNTER — Encounter (HOSPITAL_BASED_OUTPATIENT_CLINIC_OR_DEPARTMENT_OTHER): Payer: Self-pay | Admitting: *Deleted

## 2011-09-18 DIAGNOSIS — M5430 Sciatica, unspecified side: Secondary | ICD-10-CM | POA: Insufficient documentation

## 2011-09-18 DIAGNOSIS — F17200 Nicotine dependence, unspecified, uncomplicated: Secondary | ICD-10-CM | POA: Insufficient documentation

## 2011-09-18 DIAGNOSIS — M549 Dorsalgia, unspecified: Secondary | ICD-10-CM | POA: Insufficient documentation

## 2011-09-18 NOTE — ED Notes (Signed)
C/o low back pain that radiates down right leg x 2 weeks, no known injury

## 2011-09-19 ENCOUNTER — Encounter (HOSPITAL_BASED_OUTPATIENT_CLINIC_OR_DEPARTMENT_OTHER): Payer: Self-pay | Admitting: Emergency Medicine

## 2011-09-19 ENCOUNTER — Emergency Department (HOSPITAL_BASED_OUTPATIENT_CLINIC_OR_DEPARTMENT_OTHER)
Admission: EM | Admit: 2011-09-19 | Discharge: 2011-09-19 | Disposition: A | Discharge status: Home / Self Care | Payer: Self-pay | Attending: Emergency Medicine | Admitting: Emergency Medicine

## 2011-09-19 DIAGNOSIS — M5431 Sciatica, right side: Secondary | ICD-10-CM

## 2011-09-19 LAB — URINALYSIS, ROUTINE W REFLEX MICROSCOPIC
Bilirubin Urine: NEGATIVE
Hgb urine dipstick: NEGATIVE
Protein, ur: NEGATIVE mg/dL
Specific Gravity, Urine: 1.022 (ref 1.005–1.030)
Urobilinogen, UA: 1 mg/dL (ref 0.0–1.0)

## 2011-09-19 MED ORDER — KETOROLAC TROMETHAMINE 60 MG/2ML IM SOLN
60.0000 mg | Freq: Once | INTRAMUSCULAR | Status: AC
  Administered 2011-09-19: 60 mg via INTRAMUSCULAR
  Filled 2011-09-19: qty 2

## 2011-09-19 MED ORDER — DIAZEPAM 5 MG PO TABS
5.0000 mg | ORAL_TABLET | Freq: Once | ORAL | Status: AC
  Administered 2011-09-19: 5 mg via ORAL
  Filled 2011-09-19: qty 1

## 2011-09-19 MED ORDER — NAPROXEN 500 MG PO TABS: 500.0000 mg | ORAL_TABLET | Freq: Two times a day (BID) | ORAL | Status: DC

## 2011-09-19 MED ORDER — PREDNISONE 50 MG PO TABS
50.0000 mg | ORAL_TABLET | Freq: Once | ORAL | Status: AC
  Administered 2011-09-19: 50 mg via ORAL
  Filled 2011-09-19: qty 1

## 2011-09-19 MED ORDER — PREDNISONE 50 MG PO TABS: 50.0000 mg | ORAL_TABLET | Freq: Every day | ORAL | Status: DC

## 2011-09-19 MED ORDER — MORPHINE SULFATE 4 MG/ML IJ SOLN
6.0000 mg | Freq: Once | INTRAMUSCULAR | Status: AC
  Administered 2011-09-19: 6 mg via INTRAMUSCULAR
  Filled 2011-09-19 (×2): qty 1

## 2011-09-19 MED ORDER — HYDROCODONE-ACETAMINOPHEN 5-325 MG PO TABS: 1.0000 | ORAL_TABLET | ORAL | Status: DC | PRN

## 2011-09-19 NOTE — ED Provider Notes (Signed)
History     CSN: [id number] Arrival date & time: 09/19/2011 12:04 AM  Chief Complaint  Patient presents with  . Back Pain    (Consider location/radiation/quality/duration/timing/severity/associated sxs/prior treatment) HPI Comments: Patient presents with one to 2 weeks of right lower back pain that radiates to his buttocks and down his right thigh to his knee.  He has no noted injury in the last few weeks.  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  Due to this he does do lifting and bending regularly.  He has no fevers, chills, nausea, vomiting or weight loss.  No IV drug use.  Patient denies any loss of bowel or bladder control.  He describes some mild numbness in his right leg but no weakness.  He is been seen by [facility] and was given Flexeril and diclofenac which she states has not been helping his symptoms.  Patient is a 25 y.o. male presenting with back pain. The history is provided by the patient. No language interpreter was used.  Back Pain  This is a new problem. The current episode started more than 1 week ago. The problem occurs constantly. The problem has not changed since onset.The pain is associated with no known injury. The pain is present in the lumbar spine. The quality of the pain is described as shooting and burning. The pain radiates to the right thigh. The pain is moderate. The symptoms are aggravated by certain positions. Associated symptoms include numbness. Pertinent negatives include no chest pain, no fever, no headaches and no abdominal pain.    History reviewed. No pertinent past medical history.  Past Surgical History  Procedure Date  . Appendectomy     History reviewed. No pertinent family history.  History  Substance Use Topics  . Smoking status: Current Everyday Smoker -- 1.0 packs/day  . Smokeless tobacco: Not on file  . Alcohol Use: No      Review of Systems  Constitutional: Negative.  Negative for  fever and chills.  HENT: Negative.   Eyes: Negative.  Negative for discharge and redness.  Respiratory: Negative.  Negative for cough and shortness of breath.   Cardiovascular: Negative.  Negative for chest pain.  Gastrointestinal: Negative.  Negative for nausea, vomiting and abdominal pain.  Genitourinary: Negative.  Negative for hematuria.  Musculoskeletal: Positive for back pain.  Skin: Negative.  Negative for color change and rash.  Neurological: Positive for numbness. Negative for syncope and headaches.  Hematological: Negative.  Negative for adenopathy.  Psychiatric/Behavioral: Negative.  Negative for confusion.  All other systems reviewed and are negative.    Allergies  Review of patient's allergies indicates no known allergies.  Home Medications   Current Outpatient Rx  Name Route Sig Dispense Refill  . CYCLOBENZAPRINE HCL 10 MG PO TABS Oral Take 10 mg by mouth 3 (three) times daily as needed.      Marland Kitchen DICLOFENAC SODIUM 50 MG PO TBEC Oral Take 50 mg by mouth 2 (two) times daily.      Marland Kitchen ONE-DAILY MULTI VITAMINS PO TABS Oral Take 1 tablet by mouth daily.      . TRAMADOL HCL 50 MG PO TABS Oral Take 50 mg by mouth every 6 (six) hours as needed.        BP 140/70  Pulse 69  Temp(Src) 98.1 F (36.7 C) (Oral)  Resp 15  Ht 5\' 10"  (1.778 m)  Wt 180 lb (81.647 kg)  BMI 25.83 kg/m2  SpO2 100%  Physical Exam  Constitutional: He is oriented to person, place, and time. He appears well-developed and well-nourished.  Non-toxic appearance. He does not have a sickly appearance.  HENT:  Head: Normocephalic and atraumatic.  Eyes: Conjunctivae, EOM and lids are normal. Pupils are equal, round, and reactive to light.  Neck: Trachea normal, normal range of motion and full passive range of motion without pain. Neck supple.  Cardiovascular: Normal rate, regular rhythm and normal heart sounds.   Pulmonary/Chest: Effort normal and breath sounds normal. No respiratory distress.  Abdominal:  Soft. Normal appearance. He exhibits no distension. There is no tenderness. There is no rebound and no CVA tenderness.  Musculoskeletal: Normal range of motion.       There is no focal T-spine or L-spine bony tenderness in the midline.  He has mild paraspinal tenderness in the right lumbar region.  He has a positive straight leg raise on the right side with about 40 of flexion.  Sensation is intact.  Strength is intact.  Normal gait.  Neurological: He is alert and oriented to person, place, and time. He has normal strength.  Skin: Skin is warm, dry and intact. No rash noted.  Psychiatric: He has a normal mood and affect. His behavior is normal. Judgment and thought content normal.    ED Course  Procedures (including critical care time)   Labs Reviewed  URINALYSIS, ROUTINE W REFLEX MICROSCOPIC   No results found.   No diagnosis found.    MDM  Patient with sciatica clinically based on his history and physical exam.  I will start the patient on steroids and other pain medicines at home to try and assist him with his symptoms.  He also be given work restrictions for the next week.  I will give him referrals to Dr. Pearletha Forge or to neurosurgery for possible followup if his symptoms are not improving and  he also knows that he needs to get a primary care physician.  He he also understands the need to return for worsening weakness or loss of bowel or bladder control.        Nat Christen, MD 09/19/11 [phone]

## 2011-09-19 NOTE — ED Notes (Signed)
Pt states that he has been having right sided low back pain radiating to right leg x 2 weeks. Pt denies known injury but  Does c/o urinary frequency at times.

## 2011-09-19 NOTE — ED Notes (Signed)
MD at bedside. 

## 2011-09-23 ENCOUNTER — Emergency Department (INDEPENDENT_AMBULATORY_CARE_PROVIDER_SITE_OTHER): Payer: Self-pay

## 2011-09-23 ENCOUNTER — Emergency Department (HOSPITAL_BASED_OUTPATIENT_CLINIC_OR_DEPARTMENT_OTHER)
Admission: EM | Admit: 2011-09-23 | Discharge: 2011-09-23 | Disposition: A | Discharge status: Home / Self Care | Payer: Self-pay | Attending: Emergency Medicine | Admitting: Emergency Medicine

## 2011-09-23 ENCOUNTER — Encounter (HOSPITAL_BASED_OUTPATIENT_CLINIC_OR_DEPARTMENT_OTHER): Payer: Self-pay | Admitting: Emergency Medicine

## 2011-09-23 ENCOUNTER — Emergency Department (HOSPITAL_BASED_OUTPATIENT_CLINIC_OR_DEPARTMENT_OTHER): Payer: Self-pay

## 2011-09-23 DIAGNOSIS — M79609 Pain in unspecified limb: Secondary | ICD-10-CM | POA: Insufficient documentation

## 2011-09-23 DIAGNOSIS — M549 Dorsalgia, unspecified: Secondary | ICD-10-CM

## 2011-09-23 DIAGNOSIS — M5431 Sciatica, right side: Secondary | ICD-10-CM

## 2011-09-23 DIAGNOSIS — F17200 Nicotine dependence, unspecified, uncomplicated: Secondary | ICD-10-CM | POA: Insufficient documentation

## 2011-09-23 DIAGNOSIS — M5430 Sciatica, unspecified side: Secondary | ICD-10-CM | POA: Insufficient documentation

## 2011-09-23 LAB — CBC
MCH: 31.3 pg (ref 26.0–34.0)
MCV: 91.9 fL (ref 78.0–100.0)
Platelets: 261 10*3/uL (ref 150–400)
RBC: 4.57 MIL/uL (ref 4.22–5.81)

## 2011-09-23 LAB — BASIC METABOLIC PANEL
CO2: 26 mEq/L (ref 19–32)
Calcium: 9.8 mg/dL (ref 8.4–10.5)
Creatinine, Ser: 0.7 mg/dL (ref 0.50–1.35)

## 2011-09-23 MED ORDER — HYDROMORPHONE HCL 1 MG/ML IJ SOLN
1.0000 mg | Freq: Once | INTRAMUSCULAR | Status: AC
  Administered 2011-09-23: 1 mg via INTRAVENOUS
  Filled 2011-09-23: qty 1

## 2011-09-23 MED ORDER — ONDANSETRON HCL 4 MG/2ML IJ SOLN
4.0000 mg | Freq: Once | INTRAMUSCULAR | Status: AC
  Administered 2011-09-23: 4 mg via INTRAVENOUS
  Filled 2011-09-23: qty 2

## 2011-09-23 MED ORDER — OXYCODONE-ACETAMINOPHEN 5-325 MG PO TABS: 1.0000 | ORAL_TABLET | Freq: Four times a day (QID) | ORAL | Status: AC | PRN

## 2011-09-23 NOTE — ED Provider Notes (Signed)
Scribed for Ethelda Chick, MD, the patient was seen in room MH01/MH01 . This chart was scribed by Ellie Lunch. This patient's care was started at 3:02 PM.   CSN: [id number] Arrival date & time: 09/23/2011  2:53 PM   First MD Initiated Contact with Patient 09/23/11 1502      Chief Complaint  Patient presents with  . Leg Pain  . Back Pain    (Consider location/radiation/quality/duration/timing/severity/associated sxs/prior treatment) HPI Tracy York is a 25 y.o. male who presents to the Emergency Department complaining of right lower back pain. PT says he was seen in ED 09/18/2010 for similar sx, was Dx with sciatica, discharged with Norco/Naprersyn/Deltasone, and put on weight lifting restriction. Pt says this morning he attempted to lift a dresser, his right leg "gave out," and he lost control of his bladder. Pt describes pain as a hot, severe, stabbing pain that radiates down his right leg. Pt reports pain is limited by movement and reports limited ROM due to pain. Pt also reports associated numbness in right leg. There are no other associated symptoms and no other alleviating or aggravating factors.    History reviewed. No pertinent past medical history.  Past Surgical History  Procedure Date  . Appendectomy     History reviewed. No pertinent family history.  History  Substance Use Topics  . Smoking status: Current Everyday Smoker -- 1.0 packs/day  . Smokeless tobacco: Not on file  . Alcohol Use: No    Review of Systems  Genitourinary:       Urinary incontinence   Musculoskeletal:       R lower back and right leg pain  Neurological: Positive for weakness (r. foot) and numbness (r. foot).  All other systems reviewed and are negative.   Allergies  Review of patient's allergies indicates no known allergies.  Home Medications   Current Outpatient Rx  Name Route Sig Dispense Refill  . CYCLOBENZAPRINE HCL 10 MG PO TABS Oral Take 10 mg by mouth 3 (three) times  daily as needed.      Marland Kitchen DICLOFENAC SODIUM 50 MG PO TBEC Oral Take 50 mg by mouth 2 (two) times daily.      Marland Kitchen HYDROCODONE-ACETAMINOPHEN 5-325 MG PO TABS Oral Take 1 tablet by mouth every 4 (four) hours as needed for pain. 20 tablet 0  . ONE-DAILY MULTI VITAMINS PO TABS Oral Take 1 tablet by mouth daily.      Marland Kitchen NAPROXEN 500 MG PO TABS Oral Take 1 tablet (500 mg total) by mouth 2 (two) times daily. 30 tablet 0  . PREDNISONE 50 MG PO TABS Oral Take 1 tablet (50 mg total) by mouth daily. 5 tablet 0  . TRAMADOL HCL 50 MG PO TABS Oral Take 50 mg by mouth every 6 (six) hours as needed.        BP 132/72  Pulse 75  Temp(Src) 98 F (36.7 C) (Oral)  Resp 18  SpO2 100% Vitals reviewed Physical Exam  Nursing note and vitals reviewed. Constitutional: He is oriented to person, place, and time. He appears well-developed and well-nourished.  HENT:  Head: Normocephalic and atraumatic.  Eyes: Conjunctivae and EOM are normal.  Neck: Normal range of motion. Neck supple.  Cardiovascular: Normal rate and regular rhythm.   Pulmonary/Chest: Effort normal and breath sounds normal.  Abdominal: Soft. There is no tenderness.  Genitourinary:       Decreased rectal tone and sensation  Neurological: He is alert and oriented to person, place, and time.  3/5 strength w/ dorsi/plantar flexion right foot.  Decreased sensation lateral and dorsal aspect of right foot.   Skin: Skin is warm and dry.  Psychiatric: He has a normal mood and affect.   Procedures (including critical care time)  OTHER DATA REVIEWED: Nursing notes, vital signs, and past medical records reviewed.   DIAGNOSTIC STUDIES: Oxygen Saturation is 100% on room air, normal by my interpretation.    LABS / RADIOLOGY:  Results for orders placed during the hospital encounter of 09/23/11  CBC      Component Value Range   WBC 9.5  4.0 - 10.5 (K/uL)   RBC 4.57  4.22 - 5.81 (MIL/uL)   Hemoglobin 14.3  13.0 - 17.0 (g/dL)   HCT 81.1  91.4 - 78.2  (%)   MCV 91.9  78.0 - 100.0 (fL)   MCH 31.3  26.0 - 34.0 (pg)   MCHC 34.0  30.0 - 36.0 (g/dL)   RDW 95.6  21.3 - 08.6 (%)   Platelets 261  150 - 400 (K/uL)  BASIC METABOLIC PANEL      Component Value Range   Sodium 139  135 - 145 (mEq/L)   Potassium 4.1  3.5 - 5.1 (mEq/L)   Chloride 105  96 - 112 (mEq/L)   CO2 26  19 - 32 (mEq/L)   Glucose, Bld 97  70 - 99 (mg/dL)   BUN 9  6 - 23 (mg/dL)   Creatinine, Ser 5.78  0.50 - 1.35 (mg/dL)   Calcium 9.8  8.4 - 46.9 (mg/dL)   GFR calc non Af Amer >90  >90 (mL/min)   GFR calc Af Amer >90  >90 (mL/min)   Mr Lumbar Spine Wo Contrast  09/23/2011  *RADIOLOGY REPORT*  Clinical Data: Back and right leg pain.  MRI LUMBAR SPINE WITHOUT CONTRAST  Technique:  Multiplanar and multiecho pulse sequences of the lumbar spine were obtained without intravenous contrast.  Comparison: Lumbar spine series 10/02/2009.  Findings: The sagittal MR images demonstrate normal alignment of the lumbar vertebral bodies.  They demonstrate normal marrow signal.  The facets are normally aligned.  No definite pars defects.  The last full intervertebral disc space is labeled L5 dash S aligned and the conus medullaris terminates at the top of L1.  No significant paraspinal or retroperitoneal process.  L1-2:  No significant findings.  L2-3:  No significant findings.  L3-4:  Minimal disc bulge but no focal disc protrusion, spinal or foraminal stenosis.  L4-5:  Mild bulging annulus but no focal disc protrusion, spinal or foraminal stenosis.  L5-S1:  No significant findings.  IMPRESSION: Unremarkable lumbar spine MRI examination.  No significant disc protrusions, spinal or foraminal stenosis.  Original Report Authenticated By: P. Loralie Champagne, M.D.    ED COURSE / COORDINATION OF CARE: 3:15 PM EDP discussed with PT that HPI and PE indicate a possibility of a compressed root. Discussed need for immediate MRI. Plan to treat pain and possibly transfer if MRI is not available inhouse. 6:10  PM MRI result not visible in PACS- had report printed off by radiology- MRI is unremarkable, no significant disc protrusions, spinal or foraminal stenosis.   Results d/w patient  MDM: Pt with low back pain- recently diagnosed with sciatica- states he lifted dresser this morning and had acute increase in pain associated with bladder incontinence, weakness in right leg.  Subjective decreased sensation around rectum on exam.  MRI obtained emergently, however this showed no acute findings.  Pt discharged with strict return precautions.  Pt agreeable with plan   MEDICATIONS GIVEN IN THE E.D.  Medications  HYDROmorphone (DILAUDID) injection 1 mg   ondansetron (ZOFRAN) injection 4 mg    ED DISCHARGE MEDICATION New Prescriptions   OXYCODONE-ACETAMINOPHEN (PERCOCET) 5-325 MG PER TABLET    Take 1-2 tablets by mouth every 6 (six) hours as needed for pain.    SCRIBE ATTESTATION: I personally performed the services described in this documentation, which was scribed in my presence. The recorded information has been reviewed and considered.          Ethelda Chick, MD 09/23/11 [phone]

## 2011-09-23 NOTE — ED Notes (Signed)
Pt states he was seen for right leg pain and back pain approximately two weeks ago.  Pt states this am he picked up a dresser, his leg "went out", pt lost bladder control and now he cannot pick up right leg.  Pt relates he is having right foot numbness.

## 2014-01-02 ENCOUNTER — Ambulatory Visit: Payer: Self-pay | Admitting: Physician Assistant

## 2014-01-05 ENCOUNTER — Encounter: Payer: Self-pay | Admitting: Physician Assistant

## 2014-01-05 ENCOUNTER — Ambulatory Visit: Payer: Self-pay | Admitting: Physician Assistant

## 2014-01-05 ENCOUNTER — Ambulatory Visit (INDEPENDENT_AMBULATORY_CARE_PROVIDER_SITE_OTHER): Payer: BC Managed Care – PPO | Admitting: Physician Assistant

## 2014-01-05 VITALS — BP 118/73 | HR 63 | Temp 98.2°F | Resp 16 | Ht 70.0 in | Wt 205.5 lb

## 2014-01-05 DIAGNOSIS — Z0000 Encounter for general adult medical examination without abnormal findings: Secondary | ICD-10-CM

## 2014-01-05 DIAGNOSIS — I10 Essential (primary) hypertension: Secondary | ICD-10-CM

## 2014-01-05 DIAGNOSIS — M5430 Sciatica, unspecified side: Secondary | ICD-10-CM

## 2014-01-05 DIAGNOSIS — F17200 Nicotine dependence, unspecified, uncomplicated: Secondary | ICD-10-CM

## 2014-01-05 DIAGNOSIS — Z720 Tobacco use: Secondary | ICD-10-CM

## 2014-01-05 DIAGNOSIS — M5432 Sciatica, left side: Secondary | ICD-10-CM

## 2014-01-05 MED ORDER — HYDROCODONE-ACETAMINOPHEN 10-325 MG PO TABS: 1.0000 | ORAL_TABLET | Freq: Three times a day (TID) | ORAL | Status: DC | PRN

## 2014-01-05 MED ORDER — METHYLPREDNISOLONE ACETATE 40 MG/ML IJ SUSP
40.0000 mg | Freq: Once | INTRAMUSCULAR | Status: AC
  Administered 2014-01-05: 40 mg via INTRAMUSCULAR

## 2014-01-05 MED ORDER — CYCLOBENZAPRINE HCL 5 MG PO TABS: 5.0000 mg | ORAL_TABLET | Freq: Three times a day (TID) | ORAL | Status: DC | PRN

## 2014-01-05 NOTE — Progress Notes (Signed)
Patient presents to clinic today to establish care.  Acute Concerns: Patient complains of low back pain, mostly left-sided, that is been present over the past 3 days. Patient states that the pain shoots from his lower back down the posterior aspect of his thigh to his knee. Patient also endorses some weakness of his left lower extremity. Endorses tingling sensation in lower leg. Denies numbness. Denies change in bowel or bladder habits. Denies saddle anesthesia. 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 States first incident of pain was 2 years ago. Patient has occasional flareups. Records reviewed. MRI in 2012 shows multiple mild disc protrusions of lumbar spine. Patient has taken Tylenol with little relief of pain. Patient states he usually requires a steroid shot and muscle relaxers to help ease his pain. Denies epidural injection in the past. Patient has not followed by neurology, neurosurgery or orthopedics.  Chronic Issues: Hypertension -- patient endorses blood pressure well controlled with lisinopril 10 mg daily. Denies chronic cough. Denies headache, vision change, chest pain, palpitations, shortness of breath, lightheadedness or dizziness. Patient denies known history of high cholesterol.  Health Maintenance: Dental -- overdue Vision -- overdue Immunizations -- declines flu shot.  Tetanus shot 2014  Past Medical History  Diagnosis Date  . Chicken pox   . Multiple gastric ulcers   . Hypertension     Past Surgical History  Procedure Laterality Date  . Appendectomy  2008  . Wisdom tooth extraction  2009    Current Outpatient Prescriptions on File Prior to Visit  Medication Sig Dispense Refill  . Multiple Vitamin (MULTIVITAMIN) tablet Take 1 tablet by mouth daily.        No current facility-administered medications on file prior to visit.    No Known Allergies  Family History  Problem Relation Age of Onset  . Arthritis Father     Living  . Other  Father     Lung Mass  . Heart disease Father   . Heart attack Father   . Arthritis Paternal Grandfather   . Arthritis Paternal Grandmother   . Heart disease Paternal Grandmother   . Stroke Paternal Grandmother   . Heart disease Paternal Grandfather   . Alzheimer's disease Paternal Grandfather   . Hypertension Paternal Grandfather   . Hypertension Paternal Grandmother   . Diabetes Paternal Grandfather   . Heart failure Paternal Aunt   . Heart failure Paternal Uncle   . Healthy Brother     x2  . Healthy Sister     x3  . Healthy Daughter     x1  . Allergies Daughter     History   Social History  . Marital Status: Married    Spouse Name: N/A    Number of Children: N/A  . Years of Education: N/A   Occupational History  . Not on file.   Social History Main Topics  . Smoking status: Current Every Day Smoker -- 1.00 packs/day for 15 years  . Smokeless tobacco: Never Used  . Alcohol Use: 0.0 oz/week    1-2 Cans of beer per week     Comment: occasionally   . Drug Use: No  . Sexual Activity: No   Other Topics Concern  . Not on file   Social History Narrative  . No narrative on file    Review of Systems  Constitutional: Negative for fever and weight loss.  HENT: Negative for ear discharge, ear pain, hearing loss and tinnitus.   Eyes: Negative for blurred vision,  double vision, photophobia and pain.  Respiratory: Negative for cough and shortness of breath.   Cardiovascular: Positive for palpitations. Negative for chest pain.  Gastrointestinal: Negative for heartburn, nausea, vomiting, abdominal pain, diarrhea, constipation, blood in stool and melena.  Genitourinary: Negative for dysuria, urgency, frequency, hematuria and flank pain.       Nocturia x 0-1.  No concerns for ED.  Musculoskeletal: Positive for back pain. Negative for falls.  Neurological: Negative for dizziness, seizures, loss of consciousness and headaches.  Endo/Heme/Allergies: Negative for environmental  allergies.  Psychiatric/Behavioral: Negative for depression, suicidal ideas, hallucinations and substance abuse. The patient is nervous/anxious.     BP 118/73  Pulse 63  Temp(Src) 98.2 F (36.8 C) (Oral)  Resp 16  Ht 5\' 10"  (1.778 m)  Wt 205 lb 8 oz (93.214 kg)  BMI 29.49 kg/m2  SpO2 99%  Physical Exam  Vitals reviewed. Constitutional: He is oriented to person, place, and time and well-developed, well-nourished, and in no distress.  HENT:  Head: Normocephalic and atraumatic.  Right Ear: External ear normal.  Left Ear: External ear normal.  Nose: Nose normal.  Mouth/Throat: Oropharynx is clear and moist. No oropharyngeal exudate.  Tympanic membranes within normal limits bilaterally.  Eyes: Conjunctivae and EOM are normal. Pupils are equal, round, and reactive to light. Right eye exhibits no discharge. Left eye exhibits no discharge. No scleral icterus.  Neck: Normal range of motion. Neck supple.  Cardiovascular: Normal rate, regular rhythm, normal heart sounds and intact distal pulses.   No murmur heard. Pulmonary/Chest: Effort normal and breath sounds normal. No respiratory distress. He has no wheezes. He has no rales. He exhibits no tenderness.  Abdominal: Soft. Bowel sounds are normal. He exhibits no distension and no mass. There is no tenderness. There is no rebound and no guarding.  Musculoskeletal:       Left knee: Normal.       Lumbar back: He exhibits tenderness, pain and spasm. He exhibits no bony tenderness and no swelling.       Left upper leg: He exhibits tenderness. He exhibits no bony tenderness.       Left lower leg: Normal.  Positive straight leg test of left leg.  Lymphadenopathy:    He has no cervical adenopathy.  Neurological: He is alert and oriented to person, place, and time. No cranial nerve deficit. Gait normal.  Skin: Skin is warm and dry. No rash noted.  Psychiatric: Affect normal.    No results found for this or any previous visit (from the past  2160 hour(s)).  Assessment/Plan: Essential hypertension, benign Blood pressure normotensive in clinic. Asymptomatic. Continue lisinopril 10 mg daily.  Sciatica of left side Discussed need for repeat MRI with patient. Patient wants to check the cost of imaging with his insurance company. Patient given chest pain and diagnosis code. 40 mg of Depo-Medrol given by nursing staff. Rx Norco and Flexeril. Patient instructed on stretching exercises to help with pain. Avoid heavy lifting or over exertion. Patient educated on alarm signs and symptoms, and when to proceed to the ER if necessary. Referral to neurosurgery placed.  Visit for preventive health examination Medical history reviewed and updated. Patient declines flu shot. Is up-to-date on tetanus. We'll need records from previous PCP. Will obtain fasting labs.  Tobacco abuse disorder Patient voices readiness to quit. Patient has tried Chantix in the past for smoking cessation. Endorses nightmares while on medication. Spent 5 minutes discussing smoking cessation with the patient. Patient given handout on various options  for smoking cessation. Patient to read over the information and decide the options he would like to try. Will prescribe medication as indicated

## 2014-01-05 NOTE — Patient Instructions (Signed)
Please return to [facility] office for fasting labs at your earliest convenience.  Please take norco and flexeril as prescribed.  Do not use flexeril while operating a vehicle of heavy machinery.  Avoid heavy lifting or over exertion.  Activitiy as tolerated.  You will be contacted by Neurosurgeon's office for evaluation and treatment.  Please call your insurance to find out the cost of a MRI lumbar spine W/WO contrast.  Diagnosis code is 724.3.  If symptoms acutely worsen, or if you develop numbness of your groin or are having incontinence or cannot have a bowel movement, please proceed immediately to the ER.  Please read information below on smoking cessation.    Smoking Cessation Quitting smoking is important to your health and has many advantages. However, it is not always easy to quit since nicotine is a very addictive drug. Often times, people try 3 times or more before being able to quit. This document explains the best ways for you to prepare to quit smoking. Quitting takes hard work and a lot of effort, but you can do it. ADVANTAGES OF QUITTING SMOKING  You will live longer, feel better, and live better.  Your body will feel the impact of quitting smoking almost immediately.  Within 20 minutes, blood pressure decreases. Your pulse returns to its normal level.  After 8 hours, carbon monoxide levels in the blood return to normal. Your oxygen level increases.  After 24 hours, the chance of having a heart attack starts to decrease. Your breath, hair, and body stop smelling like smoke.  After 48 hours, damaged nerve endings begin to recover. Your sense of taste and smell improve.  After 72 hours, the body is virtually free of nicotine. Your bronchial tubes relax and breathing becomes easier.  After 2 to 12 weeks, lungs can hold more air. Exercise becomes easier and circulation improves.  The risk of having a heart attack, stroke, cancer, or lung disease is greatly reduced.  After 1 year,  the risk of coronary heart disease is cut in half.  After 5 years, the risk of stroke falls to the same as a nonsmoker.  After 10 years, the risk of lung cancer is cut in half and the risk of other cancers decreases significantly.  After 15 years, the risk of coronary heart disease drops, usually to the level of a nonsmoker.  If you are pregnant, quitting smoking will improve your chances of having a healthy baby.  The people you live with, especially any children, will be healthier.  You will have extra money to spend on things other than cigarettes. QUESTIONS TO THINK ABOUT BEFORE ATTEMPTING TO QUIT You may want to talk about your answers with your caregiver.  Why do you want to quit?  If you tried to quit in the past, what helped and what did not?  What will be the most difficult situations for you after you quit? How will you plan to handle them?  Who can help you through the tough times? Your family? Friends? A caregiver?  What pleasures do you get from smoking? What ways can you still get pleasure if you quit? Here are some questions to ask your caregiver:  How can you help me to be successful at quitting?  What medicine do you think would be best for me and how should I take it?  What should I do if I need more help?  What is smoking withdrawal like? How can I get information on withdrawal? GET READY  Set a quit date.  Change your environment by getting rid of all cigarettes, ashtrays, matches, and lighters in your home, car, or work. Do not let people smoke in your home.  Review your past attempts to quit. Think about what worked and what did not. GET SUPPORT AND ENCOURAGEMENT You have a better chance of being successful if you have help. You can get support in many ways.  Tell your family, friends, and co-workers that you are going to quit and need their support. Ask them not to smoke around you.  Get individual, group, or telephone counseling and support.  Programs are available at [employer] and health centers. Call your local health department for information about programs in your area.  Spiritual beliefs and practices may help some smokers quit.  Download a "quit meter" on your computer to keep track of quit statistics, such as how long you have gone without smoking, cigarettes not smoked, and money saved.  Get a self-help book about quitting smoking and staying off of tobacco. LEARN NEW SKILLS AND BEHAVIORS  Distract yourself from urges to smoke. Talk to someone, go for a walk, or occupy your time with a task.  Change your normal routine. Take a different route to work. Drink tea instead of coffee. Eat breakfast in a different place.  Reduce your stress. Take a hot bath, exercise, or read a book.  Plan something enjoyable to do every day. Reward yourself for not smoking.  Explore interactive web-based programs that specialize in helping you quit. GET MEDICINE AND USE IT CORRECTLY Medicines can help you stop smoking and decrease the urge to smoke. Combining medicine with the above behavioral methods and support can greatly increase your chances of successfully quitting smoking.  Nicotine replacement therapy helps deliver nicotine to your body without the negative effects and risks of smoking. Nicotine replacement therapy includes nicotine gum, lozenges, inhalers, nasal sprays, and skin patches. Some may be available over-the-counter and others require a prescription.  Antidepressant medicine helps people abstain from smoking, but how this works is unknown. This medicine is available by prescription.  Nicotinic receptor partial agonist medicine simulates the effect of nicotine in your brain. This medicine is available by prescription. Ask your caregiver for advice about which medicines to use and how to use them based on your health history. Your caregiver will tell you what side effects to look out for if you choose to be on a  medicine or therapy. Carefully read the information on the package. Do not use any other product containing nicotine while using a nicotine replacement product.  RELAPSE OR DIFFICULT SITUATIONS Most relapses occur within the first 3 months after quitting. Do not be discouraged if you start smoking again. Remember, most people try several times before finally quitting. You may have symptoms of withdrawal because your body is used to nicotine. You may crave cigarettes, be irritable, feel very hungry, cough often, get headaches, or have difficulty concentrating. The withdrawal symptoms are only temporary. They are strongest when you first quit, but they will go away within 10 14 days. To reduce the chances of relapse, try to:  Avoid drinking alcohol. Drinking lowers your chances of successfully quitting.  Reduce the amount of caffeine you consume. Once you quit smoking, the amount of caffeine in your body increases and can give you symptoms, such as a rapid heartbeat, sweating, and anxiety.  Avoid smokers because they can make you want to smoke.  Do not let weight gain distract you. Many  smokers will gain weight when they quit, usually less than 10 pounds. Eat a healthy diet and stay active. You can always lose the weight gained after you quit.  Find ways to improve your mood other than smoking. FOR MORE INFORMATION  www.smokefree.gov  Document Released: 11/14/2001 Document Revised: 05/21/2012 Document Reviewed: 02/29/2012 [facility] Patient Information 2014 [employer], [place].

## 2014-01-05 NOTE — Progress Notes (Signed)
Pre visit review using our clinic review tool, if applicable. No additional management support is needed unless otherwise documented below in the visit note/SLS  

## 2014-01-06 DIAGNOSIS — Z720 Tobacco use: Secondary | ICD-10-CM | POA: Insufficient documentation

## 2014-01-06 DIAGNOSIS — M5432 Sciatica, left side: Secondary | ICD-10-CM | POA: Insufficient documentation

## 2014-01-06 DIAGNOSIS — I10 Essential (primary) hypertension: Secondary | ICD-10-CM | POA: Insufficient documentation

## 2014-01-06 DIAGNOSIS — Z0000 Encounter for general adult medical examination without abnormal findings: Secondary | ICD-10-CM | POA: Insufficient documentation

## 2014-01-06 MED ORDER — LISINOPRIL 10 MG PO TABS: 10.0000 mg | ORAL_TABLET | Freq: Every day | ORAL | Status: DC

## 2014-01-06 NOTE — Assessment & Plan Note (Signed)
Discussed need for repeat MRI with patient. Patient wants to check the cost of imaging with his insurance company. Patient given chest pain and diagnosis code. 40 mg of Depo-Medrol given by nursing staff. Rx Norco and Flexeril. Patient instructed on stretching exercises to help with pain. Avoid heavy lifting or over exertion. Patient educated on alarm signs and symptoms, and when to proceed to the ER if necessary. Referral to neurosurgery placed.

## 2014-01-06 NOTE — Assessment & Plan Note (Signed)
Blood pressure normotensive in clinic. Asymptomatic. Continue lisinopril 10 mg daily.

## 2014-01-06 NOTE — Assessment & Plan Note (Signed)
Medical history reviewed and updated. Patient declines flu shot. Is up-to-date on tetanus. We'll need records from previous PCP. Will obtain fasting labs.

## 2014-01-06 NOTE — Assessment & Plan Note (Addendum)
Patient voices readiness to quit. Patient has tried Chantix in the past for smoking cessation. Endorses nightmares while on medication. Spent 5 minutes discussing smoking cessation with the patient. Patient given handout on various options for smoking cessation. Patient to read over the information and decide the options he would like to try. Will prescribe medication as indicated

## 2014-01-07 ENCOUNTER — Ambulatory Visit: Payer: Self-pay | Admitting: Physician Assistant

## 2014-01-09 ENCOUNTER — Telehealth: Payer: Self-pay | Admitting: Physician Assistant

## 2014-01-09 NOTE — Telephone Encounter (Signed)
Needs to discuss his appt with [facility]

## 2014-01-09 NOTE — Telephone Encounter (Signed)
Patient reports that that the Hydrocodone is subtherapeutic when taking [1] tablet, but causing severe nausea if taking [2] at a time; wants to know if this is a matter to be discussed with [facility] [appt today at 1:00p] and/or if this needed to be handled via PCP. Per VO for PCP, pt needs to bring this matter up with Neurology today, as they will be managing his need for this medication in the future and also, pt informed to take probiotic daily and/or ginger supplement 30 Minutes prior to taking medication to help with nausea. Pt understood & agreed/SLS

## 2014-01-12 ENCOUNTER — Encounter: Payer: Self-pay | Admitting: Family

## 2014-01-12 ENCOUNTER — Ambulatory Visit (INDEPENDENT_AMBULATORY_CARE_PROVIDER_SITE_OTHER): Payer: BC Managed Care – PPO | Admitting: Family

## 2014-01-12 ENCOUNTER — Ambulatory Visit: Payer: BC Managed Care – PPO | Admitting: Family

## 2014-01-12 VITALS — BP 110/80 | HR 67 | Temp 98.3°F | Resp 12 | Ht 70.0 in | Wt 202.1 lb

## 2014-01-12 DIAGNOSIS — M5440 Lumbago with sciatica, unspecified side: Secondary | ICD-10-CM | POA: Insufficient documentation

## 2014-01-12 DIAGNOSIS — M549 Dorsalgia, unspecified: Secondary | ICD-10-CM

## 2014-01-12 MED ORDER — METHYLPREDNISOLONE 4 MG PO KIT: PACK | ORAL | Status: DC

## 2014-01-12 NOTE — Assessment & Plan Note (Signed)
Patient seeing [facility] who is coordinating a future MRI. Medrol Dose Pack was prescribed to relieve pressure to spine until MRI can be scheduled. Advised patient too early to refill hydrocodone.

## 2014-01-12 NOTE — Patient Instructions (Signed)
Start medrol dose pak for your back pain. Keep upcoming follow up MRI with Neurosurgeon.

## 2014-01-12 NOTE — Progress Notes (Signed)
Subjective:    Patient ID: Tracy York, male    DOB: 09/30/1986, 28 y.o.   MRN: [id number]  Back Pain Associated symptoms include numbness. Pertinent negatives include no fever.   Tracy York is a 28 year old male who presents today with a chief complaint of back pain.  Patient was seen on 01/05/14 for same and was prescribed flexeril and hydrocodone. Patient also saw [facility] on 01/09/14 and will be scheduling his MRI. Patient reports Flexeril is helping with pain and takes at night for sleep Patient prescribed hydrocodone 1 tablet every three hours as needed but reports 1 tablet is subtherapeutic.so has been taking 2 hydrocodone tablets which is causing nausea.    Review of Systems  Constitutional: Negative for fever and chills.  HENT: Negative for congestion and rhinorrhea.   Respiratory: Negative for cough.   Musculoskeletal: Positive for back pain.       Chronic back pain  Neurological: Positive for numbness.       Patient continues to have left foot numbness. Reports descending pain from left buttocks down to left knee.    Past Medical History  Diagnosis Date  . Chicken pox   . Multiple gastric ulcers   . Hypertension     History   Social History  . Marital Status: Married    Spouse Name: N/A    Number of Children: N/A  . Years of Education: N/A   Occupational History  . Not on file.   Social History Main Topics  . Smoking status: Current Every Day Smoker -- 0.50 packs/day for 15 years  . Smokeless tobacco: Never Used  . Alcohol Use: 0.0 oz/week    1-2 Cans of beer per week     Comment: occasionally   . Drug Use: No  . Sexual Activity: No   Other Topics Concern  . Not on file   Social History Narrative  . No narrative on file    Past Surgical History  Procedure Laterality Date  . Appendectomy  2008  . Wisdom tooth extraction  2009    Family History  Problem Relation Age of Onset  . Arthritis Father     Living  . Other Father     Lung  Mass  . Heart disease Father   . Heart attack Father   . Arthritis Paternal Grandfather   . Arthritis Paternal Grandmother   . Heart disease Paternal Grandmother   . Stroke Paternal Grandmother   . Heart disease Paternal Grandfather   . Alzheimer's disease Paternal Grandfather   . Hypertension Paternal Grandfather   . Hypertension Paternal Grandmother   . Diabetes Paternal Grandfather   . Heart failure Paternal Aunt   . Heart failure Paternal Uncle   . Healthy Brother     x2  . Healthy Sister     x3  . Healthy Daughter     x1  . Allergies Daughter     No Known Allergies  Current Outpatient Prescriptions on File Prior to Visit  Medication Sig Dispense Refill  . cyclobenzaprine (FLEXERIL) 5 MG tablet Take 1 tablet (5 mg total) by mouth 3 (three) times daily as needed for muscle spasms.  30 tablet  1  . HYDROcodone-acetaminophen (NORCO) 10-325 MG per tablet Take 1 tablet by mouth every 8 (eight) hours as needed.  30 tablet  0  . lisinopril (PRINIVIL,ZESTRIL) 10 MG tablet Take 1 tablet (10 mg total) by mouth daily.  30 tablet  2  . Multiple Vitamin (  MULTIVITAMIN) tablet Take 1 tablet by mouth daily.        No current facility-administered medications on file prior to visit.    BP 110/80  Pulse 67  Temp(Src) 98.3 F (36.8 C) (Oral)  Resp 12  Ht 5\' 10"  (1.778 m)  Wt 202 lb 1.9 oz (91.681 kg)  BMI 29.00 kg/m2  SpO2 100%       Objective:   Physical Exam  Constitutional: He is oriented to person, place, and time. He appears well-nourished.  HENT:  Head: Normocephalic.  Cardiovascular: Normal rate and regular rhythm.   Pulmonary/Chest: Breath sounds normal. No respiratory distress.  Musculoskeletal: Normal range of motion. He exhibits tenderness. He exhibits no edema.  Tender to lower lumbar spine.  Neurological: He is alert and oriented to person, place, and time.  Reflex Scores:      Patellar reflexes are 3+ on the right side and 3+ on the left side. LLE strength  is is 4-5/5 on left and 5/5 on right.    Skin: Skin is warm and dry.  Psychiatric: He has a normal mood and affect.          Assessment & Plan:

## 2014-01-12 NOTE — Progress Notes (Signed)
Pre visit review using our clinic review tool, if applicable. No additional management support is needed unless otherwise documented below in the visit note. 

## 2014-01-22 ENCOUNTER — Telehealth: Payer: Self-pay | Admitting: Physician Assistant

## 2014-01-22 DIAGNOSIS — M5432 Sciatica, left side: Secondary | ICD-10-CM

## 2014-01-22 MED ORDER — HYDROCODONE-ACETAMINOPHEN 10-325 MG PO TABS: 1.0000 | ORAL_TABLET | Freq: Three times a day (TID) | ORAL | Status: DC | PRN

## 2014-01-22 NOTE — Telephone Encounter (Signed)
Please Advise

## 2014-01-22 NOTE — Telephone Encounter (Signed)
Refill Hydrocodone. It will be at [facility] office for pick up. No additional refills until patient has imaging.

## 2014-01-22 NOTE — Telephone Encounter (Signed)
Patient informed, understood & agreed; has MRI scheduled for Sunday/SLS

## 2014-01-22 NOTE — Telephone Encounter (Signed)
Patient states the the neurologist that he referred him to wants him to have an MRI. He states that their office is in the process of scheduling MRI. He would like to know if he could have a refill of hydrocodone to last him until his MRI and when he can see the neurologist.

## 2014-01-30 ENCOUNTER — Telehealth: Payer: Self-pay | Admitting: *Deleted

## 2014-01-30 NOTE — Telephone Encounter (Signed)
Spoke with pt & informed him that Lab informed us that he had not done labs ordered for him on 02.03.15; per provider, pt cannot receive any further Hydrocodone refills until his labs have been done. Pt states that he will be in to have labs drawn at the first of next week, that it "completely slipped his mind". In previous conversation, pt was informed the same RE: MRI needing to be completed prior to ant further Hydrocodone refills; pt states imaging has been completed and he is awaiting results/SLS

## 2014-03-18 ENCOUNTER — Ambulatory Visit (INDEPENDENT_AMBULATORY_CARE_PROVIDER_SITE_OTHER): Payer: BC Managed Care – PPO | Admitting: Physician Assistant

## 2014-03-18 ENCOUNTER — Encounter: Payer: Self-pay | Admitting: Physician Assistant

## 2014-03-18 ENCOUNTER — Other Ambulatory Visit: Payer: Self-pay | Admitting: Physician Assistant

## 2014-03-18 VITALS — BP 110/68 | HR 54 | Temp 98.0°F | Resp 16 | Ht 70.0 in | Wt 194.8 lb

## 2014-03-18 DIAGNOSIS — M5430 Sciatica, unspecified side: Secondary | ICD-10-CM

## 2014-03-18 DIAGNOSIS — F17200 Nicotine dependence, unspecified, uncomplicated: Secondary | ICD-10-CM

## 2014-03-18 DIAGNOSIS — R5381 Other malaise: Secondary | ICD-10-CM

## 2014-03-18 DIAGNOSIS — Z299 Encounter for prophylactic measures, unspecified: Secondary | ICD-10-CM

## 2014-03-18 DIAGNOSIS — M549 Dorsalgia, unspecified: Secondary | ICD-10-CM

## 2014-03-18 DIAGNOSIS — Z720 Tobacco use: Secondary | ICD-10-CM

## 2014-03-18 DIAGNOSIS — R5383 Other fatigue: Secondary | ICD-10-CM

## 2014-03-18 DIAGNOSIS — M5432 Sciatica, left side: Secondary | ICD-10-CM

## 2014-03-18 LAB — LIPID PANEL
Cholesterol: 171 mg/dL (ref 0–200)
HDL: 46 mg/dL (ref >39)
LDL CALC: 100 mg/dL — AB (ref 0–99)
Total CHOL/HDL Ratio: 3.7 Ratio
Triglycerides: 127 mg/dL (ref <150)
VLDL: 25 mg/dL (ref 0–40)

## 2014-03-18 LAB — COMPREHENSIVE METABOLIC PANEL
ALBUMIN: 4.4 g/dL (ref 3.5–5.2)
ALT: 15 U/L (ref 0–53)
AST: 20 U/L (ref 0–37)
Alkaline Phosphatase: 58 U/L (ref 39–117)
BUN: 13 mg/dL (ref 6–23)
CALCIUM: 9.8 mg/dL (ref 8.4–10.5)
CHLORIDE: 106 meq/L (ref 96–112)
CO2: 28 meq/L (ref 19–32)
Creat: 1.09 mg/dL (ref 0.50–1.35)
Glucose, Bld: 64 mg/dL — ABNORMAL LOW (ref 70–99)
Potassium: 4.9 mEq/L (ref 3.5–5.3)
SODIUM: 142 meq/L (ref 135–145)
TOTAL PROTEIN: 6.8 g/dL (ref 6.0–8.3)
Total Bilirubin: 0.3 mg/dL (ref 0.2–1.2)

## 2014-03-18 LAB — TSH: TSH: 1.602 u[IU]/mL (ref 0.350–4.500)

## 2014-03-18 LAB — CBC
HCT: 43.3 % (ref 39.0–52.0)
HEMOGLOBIN: 15 g/dL (ref 13.0–17.0)
MCH: 31.4 pg (ref 26.0–34.0)
MCHC: 34.6 g/dL (ref 30.0–36.0)
MCV: 90.6 fL (ref 78.0–100.0)
PLATELETS: 287 10*3/uL (ref 150–400)
RBC: 4.78 MIL/uL (ref 4.22–5.81)
RDW: 13.2 % (ref 11.5–15.5)
WBC: 9.4 10*3/uL (ref 4.0–10.5)

## 2014-03-18 MED ORDER — GABAPENTIN 100 MG PO CAPS: 100.0000 mg | ORAL_CAPSULE | Freq: Three times a day (TID) | ORAL | Status: DC

## 2014-03-18 MED ORDER — VARENICLINE TARTRATE 0.5 MG X 11 & 1 MG X 42 PO MISC: ORAL | Status: DC

## 2014-03-18 MED ORDER — HYDROCODONE-ACETAMINOPHEN 10-325 MG PO TABS: 1.0000 | ORAL_TABLET | Freq: Three times a day (TID) | ORAL | Status: DC | PRN

## 2014-03-18 NOTE — Patient Instructions (Signed)
Please take medications as directed.  For now, take the gabapentin (100 mg tablet) three times per day.    Begin Chantix -- follow instructions on packaging.  If you develop severe vivid dreams, anxiety, thoughts of harming yourself or others, please stop medication and call our office.  Follow-up in 1 month  Please obtain labs.  I will call you with your results.  Smoking Cessation Quitting smoking is important to your health and has many advantages. However, it is not always easy to quit since nicotine is a very addictive drug. Often times, people try 3 times or more before being able to quit. This document explains the best ways for you to prepare to quit smoking. Quitting takes hard work and a lot of effort, but you can do it. ADVANTAGES OF QUITTING SMOKING  You will live longer, feel better, and live better.  Your body will feel the impact of quitting smoking almost immediately.  Within 20 minutes, blood pressure decreases. Your pulse returns to its normal level.  After 8 hours, carbon monoxide levels in the blood return to normal. Your oxygen level increases.  After 24 hours, the chance of having a heart attack starts to decrease. Your breath, hair, and body stop smelling like smoke.  After 48 hours, damaged nerve endings begin to recover. Your sense of taste and smell improve.  After 72 hours, the body is virtually free of nicotine. Your bronchial tubes relax and breathing becomes easier.  After 2 to 12 weeks, lungs can hold more air. Exercise becomes easier and circulation improves.  The risk of having a heart attack, stroke, cancer, or lung disease is greatly reduced.  After 1 year, the risk of coronary heart disease is cut in half.  After 5 years, the risk of stroke falls to the same as a nonsmoker.  After 10 years, the risk of lung cancer is cut in half and the risk of other cancers decreases significantly.  After 15 years, the risk of coronary heart disease drops,  usually to the level of a nonsmoker.  If you are pregnant, quitting smoking will improve your chances of having a healthy baby.  The people you live with, especially any children, will be healthier.  You will have extra money to spend on things other than cigarettes. QUESTIONS TO THINK ABOUT BEFORE ATTEMPTING TO QUIT You may want to talk about your answers with your caregiver.  Why do you want to quit?  If you tried to quit in the past, what helped and what did not?  What will be the most difficult situations for you after you quit? How will you plan to handle them?  Who can help you through the tough times? Your family? Friends? A caregiver?  What pleasures do you get from smoking? What ways can you still get pleasure if you quit? Here are some questions to ask your caregiver:  How can you help me to be successful at quitting?  What medicine do you think would be best for me and how should I take it?  What should I do if I need more help?  What is smoking withdrawal like? How can I get information on withdrawal? GET READY  Set a quit date.  Change your environment by getting rid of all cigarettes, ashtrays, matches, and lighters in your home, car, or work. Do not let people smoke in your home.  Review your past attempts to quit. Think about what worked and what did not. GET SUPPORT AND ENCOURAGEMENT  You have a better chance of being successful if you have help. You can get support in many ways.  Tell your family, friends, and co-workers that you are going to quit and need their support. Ask them not to smoke around you.  Get individual, group, or telephone counseling and support. Programs are available at [facility] and health centers. Call your local health department for information about programs in your area.  Spiritual beliefs and practices may help some smokers quit.  Download a "quit meter" on your computer to keep track of quit statistics, such as how long  you have gone without smoking, cigarettes not smoked, and money saved.  Get a self-help book about quitting smoking and staying off of tobacco. LEARN NEW SKILLS AND BEHAVIORS  Distract yourself from urges to smoke. Talk to someone, go for a walk, or occupy your time with a task.  Change your normal routine. Take a different route to work. Drink tea instead of coffee. Eat breakfast in a different place.  Reduce your stress. Take a hot bath, exercise, or read a book.  Plan something enjoyable to do every day. Reward yourself for not smoking.  Explore interactive web-based programs that specialize in helping you quit. GET MEDICINE AND USE IT CORRECTLY Medicines can help you stop smoking and decrease the urge to smoke. Combining medicine with the above behavioral methods and support can greatly increase your chances of successfully quitting smoking.  Nicotine replacement therapy helps deliver nicotine to your body without the negative effects and risks of smoking. Nicotine replacement therapy includes nicotine gum, lozenges, inhalers, nasal sprays, and skin patches. Some may be available over-the-counter and others require a prescription.  Antidepressant medicine helps people abstain from smoking, but how this works is unknown. This medicine is available by prescription.  Nicotinic receptor partial agonist medicine simulates the effect of nicotine in your brain. This medicine is available by prescription. Ask your caregiver for advice about which medicines to use and how to use them based on your health history. Your caregiver will tell you what side effects to look out for if you choose to be on a medicine or therapy. Carefully read the information on the package. Do not use any other product containing nicotine while using a nicotine replacement product.  RELAPSE OR DIFFICULT SITUATIONS Most relapses occur within the first 3 months after quitting. Do not be discouraged if you start smoking  again. Remember, most people try several times before finally quitting. You may have symptoms of withdrawal because your body is used to nicotine. You may crave cigarettes, be irritable, feel very hungry, cough often, get headaches, or have difficulty concentrating. The withdrawal symptoms are only temporary. They are strongest when you first quit, but they will go away within 10 14 days. To reduce the chances of relapse, try to:  Avoid drinking alcohol. Drinking lowers your chances of successfully quitting.  Reduce the amount of caffeine you consume. Once you quit smoking, the amount of caffeine in your body increases and can give you symptoms, such as a rapid heartbeat, sweating, and anxiety.  Avoid smokers because they can make you want to smoke.  Do not let weight gain distract you. Many smokers will gain weight when they quit, usually less than 10 pounds. Eat a healthy diet and stay active. You can always lose the weight gained after you quit.  Find ways to improve your mood other than smoking. FOR MORE INFORMATION  www.smokefree.gov  Document Released: 11/14/2001 Document Revised: 05/21/2012  Document Reviewed: 02/29/2012 [facility] Patient Information 2014 [place].

## 2014-03-18 NOTE — Assessment & Plan Note (Signed)
Will start Chantix for smoking cessation.

## 2014-03-18 NOTE — Progress Notes (Signed)
Pre visit review using our clinic review tool, if applicable. No additional management support is needed unless otherwise documented below in the visit note/SLS  

## 2014-03-18 NOTE — Progress Notes (Signed)
Patient presents to clinic today to discuss medication options for chronic LBP with radicular symptoms.  Patient has been re-evaluated by neurosurgeon.  Repeat MRI performed revealing bulging disc at L3, L4, L5.  Patient states surgeon wishes to avoid surgery and attempt corticosteroid injections.  Cannot have this done until the end of the month.  Still denies saddle anesthesia or bowel/bladder incontinence.   Patient also endorses fatigue.  Is unsure if this is stemming from pain.  Denies depressed mood or anhedonia.  Patient is fasting for labs that he did not obtain at last visit.  Patient wishes to attempt chantix for smoking cessation.  Denies history of mental health disorder.  States he tried Chantix in the past but could not continue due to lack of insurance and OOP cost of medication.   Past Medical History  Diagnosis Date  . Chicken pox   . Multiple gastric ulcers   . Hypertension     Current Outpatient Prescriptions on File Prior to Visit  Medication Sig Dispense Refill  . cyclobenzaprine (FLEXERIL) 5 MG tablet Take 1 tablet (5 mg total) by mouth 3 (three) times daily as needed for muscle spasms.  30 tablet  1  . lisinopril (PRINIVIL,ZESTRIL) 10 MG tablet Take 1 tablet (10 mg total) by mouth daily.  30 tablet  2  . Multiple Vitamin (MULTIVITAMIN) tablet Take 1 tablet by mouth daily.        No current facility-administered medications on file prior to visit.    No Known Allergies  Family History  Problem Relation Age of Onset  . Arthritis Father     Living  . Other Father     Lung Mass  . Heart disease Father   . Heart attack Father   . Arthritis Paternal Grandfather   . Arthritis Paternal Grandmother   . Heart disease Paternal Grandmother   . Stroke Paternal Grandmother   . Heart disease Paternal Grandfather   . Alzheimer's disease Paternal Grandfather   . Hypertension Paternal Grandfather   . Hypertension Paternal Grandmother   . Diabetes Paternal Grandfather   .  Heart failure Paternal Aunt   . Heart failure Paternal Uncle   . Healthy Brother     x2  . Healthy Sister     x3  . Healthy Daughter     x1  . Allergies Daughter     History   Social History  . Marital Status: Married    Spouse Name: N/A    Number of Children: N/A  . Years of Education: N/A   Social History Main Topics  . Smoking status: Current Every Day Smoker -- 0.50 packs/day for 15 years  . Smokeless tobacco: Never Used  . Alcohol Use: 0.0 oz/week    1-2 Cans of beer per week     Comment: occasionally   . Drug Use: No  . Sexual Activity: No   Other Topics Concern  . None   Social History Narrative  . None   Review of Systems - See HPI.  All other ROS are negative.  BP 110/68  Pulse 54  Temp(Src) 98 F (36.7 C) (Oral)  Resp 16  Ht 5\' 10"  (1.778 m)  Wt 194 lb 12 oz (88.338 kg)  BMI 27.94 kg/m2  SpO2 98%  Physical Exam  Vitals reviewed. Constitutional: He is oriented to person, place, and time and well-developed, well-nourished, and in no distress.  HENT:  Head: Normocephalic and atraumatic.  Eyes: Conjunctivae are normal.  Neck: Normal range of  motion. Neck supple. No thyromegaly present.  Cardiovascular: Normal rate, regular rhythm, normal heart sounds and intact distal pulses.   Pulmonary/Chest: Breath sounds normal. No respiratory distress. He has no wheezes. He has no rales. He exhibits no tenderness.  Neurological: He is alert and oriented to person, place, and time. He has normal reflexes. Gait normal.  Skin: Skin is warm and dry. No rash noted.  Psychiatric: Affect normal.   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  Will also start Gabapentin 100 mg TID.  Continue follow-up with neurosurgeon for injection.  Continue stretching exercises.  Follow-up in 3-4 weeks.  Tobacco abuse disorder Will start Chantix for smoking cessation.  Other malaise and fatigue Likely due to chronic back pain.  Will obtain  TSH, CBC, CMP and Testosterone levels.

## 2014-03-18 NOTE — Assessment & Plan Note (Signed)
Likely due to chronic back pain.  Will obtain TSH, CBC, CMP and Testosterone levels.

## 2014-03-18 NOTE — Assessment & Plan Note (Signed)
Will restart Norco.  Will also start Gabapentin 100 mg TID.  Continue follow-up with neurosurgeon for injection.  Continue stretching exercises.  Follow-up in 3-4 weeks.

## 2014-03-19 ENCOUNTER — Telehealth: Payer: Self-pay | Admitting: Physician Assistant

## 2014-03-19 DIAGNOSIS — F430 Acute stress reaction: Secondary | ICD-10-CM

## 2014-03-19 LAB — TESTOSTERONE, FREE, TOTAL, SHBG
SEX HORMONE BINDING: 22 nmol/L (ref 13–71)
TESTOSTERONE FREE: 79.1 pg/mL (ref 47.0–244.0)
TESTOSTERONE-% FREE: 2.5 % (ref 1.6–2.9)
TESTOSTERONE: 322 ng/dL (ref 300–890)

## 2014-03-19 LAB — URINALYSIS, ROUTINE W REFLEX MICROSCOPIC

## 2014-03-19 MED ORDER — ALPRAZOLAM 1 MG PO TABS: 0.5000 mg | ORAL_TABLET | Freq: Two times a day (BID) | ORAL | Status: DC | PRN

## 2014-03-19 NOTE — Telephone Encounter (Signed)
Will fax in Xanax 1 mg tablets -- take 0.5-1 tablet every 12 hours if needed for anxiety.  Use sparingly.

## 2014-03-19 NOTE — Telephone Encounter (Signed)
Patient informed, understood & agreed/SLS  

## 2014-03-19 NOTE — Telephone Encounter (Signed)
Requesting lab results. His father was just told that he only have 2-3 mths to live. Pt is very upset and is requesting something for anxiety. Has been on Xanax before. Please advise.

## 2014-03-23 ENCOUNTER — Telehealth: Payer: Self-pay | Admitting: *Deleted

## 2014-03-23 DIAGNOSIS — M5432 Sciatica, left side: Secondary | ICD-10-CM

## 2014-03-23 MED ORDER — HYDROCODONE-ACETAMINOPHEN 10-325 MG PO TABS: 1.0000 | ORAL_TABLET | Freq: Four times a day (QID) | ORAL | Status: DC | PRN

## 2014-03-23 NOTE — Telephone Encounter (Signed)
Pt called requesting refill of hydrocodone. States he only has about 4 tablets left. Pt states he has been having to take 1 tablet every 4 hours because it does not last. States it helps his pain for 1 hour. Last rx was provided on 03/18/14, #30.  Advised pt I would forward message to PCP and someone would call him with the outcome tomorrow. Pt voices understanding.

## 2014-03-23 NOTE — Telephone Encounter (Signed)
Medication will be refilled one time only.  Will send in enough for 1-2 tablets every 6 hours.  He informed me he will be seeing the surgeon for injection at the end of this month.  Patient will need further refills from his specialist who is treating his back pain.

## 2014-03-23 NOTE — Telephone Encounter (Signed)
Patient informed, understood & agreed/SLS  

## 2014-04-06 ENCOUNTER — Ambulatory Visit (INDEPENDENT_AMBULATORY_CARE_PROVIDER_SITE_OTHER): Payer: BC Managed Care – PPO | Admitting: Physician Assistant

## 2014-04-06 ENCOUNTER — Encounter: Payer: Self-pay | Admitting: Physician Assistant

## 2014-04-06 VITALS — BP 125/79 | HR 69 | Temp 98.4°F | Resp 16 | Ht 70.0 in | Wt 195.2 lb

## 2014-04-06 DIAGNOSIS — M5430 Sciatica, unspecified side: Secondary | ICD-10-CM

## 2014-04-06 DIAGNOSIS — G8929 Other chronic pain: Secondary | ICD-10-CM

## 2014-04-06 DIAGNOSIS — M545 Low back pain, unspecified: Secondary | ICD-10-CM

## 2014-04-06 DIAGNOSIS — M5440 Lumbago with sciatica, unspecified side: Secondary | ICD-10-CM

## 2014-04-06 DIAGNOSIS — F430 Acute stress reaction: Secondary | ICD-10-CM

## 2014-04-06 MED ORDER — ALPRAZOLAM 2 MG PO TABS: 1.0000 mg | ORAL_TABLET | Freq: Three times a day (TID) | ORAL | Status: DC | PRN

## 2014-04-06 MED ORDER — OXYCODONE-ACETAMINOPHEN 7.5-325 MG PO TABS: 1.0000 | ORAL_TABLET | Freq: Three times a day (TID) | ORAL | Status: DC | PRN

## 2014-04-06 NOTE — Patient Instructions (Signed)
Please continue medications as directed.  Try the Percocet to help you with back pain until you see specialist.  Take new Xanax prescription as directed. Please read form for Counseling Services.  Call and set up an appointment. I think this will be very beneficial for you.

## 2014-04-06 NOTE — Progress Notes (Signed)
Pre visit review using our clinic review tool, if applicable. No additional management support is needed unless otherwise documented below in the visit note/SLS  

## 2014-04-07 DIAGNOSIS — F430 Acute stress reaction: Secondary | ICD-10-CM | POA: Insufficient documentation

## 2014-04-07 NOTE — Assessment & Plan Note (Signed)
Due to grief over her loss of loved one. Anxiety symptoms predominate. Rx Xanax 1 mg 3 times daily. Patient given handout for counseling services. Patient is to set up with a counselor within [facility].

## 2014-04-07 NOTE — Progress Notes (Signed)
Patient presents to clinic today to discuss anxiety and to followup on his back pain. Patient recently lost his father has been experiencing some anxiety and acute grief reaction. Patient denies depressed mood, suicidal thought or ideation. He endorses feeling very emotional which sometimes culminating in a panic attack. Patient was prescribed Xanax at last visit. Patient states he is taking as prescribed. Offer some relief from his symptoms. Patient is requesting to see a counselor to help him through this hard time. Patient also presents to discuss back pain. Patient is scheduled to see a neurosurgeon for spinal injection. Unfortunately patient to reschedule appointment do to his father's funeral. Has appointment rescheduled for later this month. Patient has run out of his pain medication. States the Norco offers very little relief of his back pain. Patient denies new or worsening symptoms. 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  Past Medical History  Diagnosis Date  . Chicken pox   . Multiple gastric ulcers   . Hypertension     Current Outpatient Prescriptions on File Prior to Visit  Medication Sig Dispense Refill  . cyclobenzaprine (FLEXERIL) 5 MG tablet Take 1 tablet (5 mg total) by mouth 3 (three) times daily as needed for muscle spasms.  30 tablet  1  . gabapentin (NEURONTIN) 100 MG capsule Take 1 capsule (100 mg total) by mouth 3 (three) times daily.  90 capsule  0  . lisinopril (PRINIVIL,ZESTRIL) 10 MG tablet Take 1 tablet (10 mg total) by mouth daily.  30 tablet  2  . Multiple Vitamin (MULTIVITAMIN) tablet Take 1 tablet by mouth daily.       . varenicline (CHANTIX STARTING MONTH PAK) 0.5 MG X 11 & 1 MG X 42 tablet Take one 0.5mg  tablet by mouth once daily for 3 days, then increase to one 0.5mg  tablet twice daily for 3 days, then increase to one 1mg  tablet twice daily.  53 tablet  0   No current facility-administered medications on file prior to visit.    No Known  Allergies  Family History  Problem Relation Age of Onset  . Arthritis Father     Living  . Other Father     Lung Mass  . Heart disease Father   . Heart attack Father   . Arthritis Paternal Grandfather   . Arthritis Paternal Grandmother   . Heart disease Paternal Grandmother   . Stroke Paternal Grandmother   . Heart disease Paternal Grandfather   . Alzheimer's disease Paternal Grandfather   . Hypertension Paternal Grandfather   . Hypertension Paternal Grandmother   . Diabetes Paternal Grandfather   . Heart failure Paternal Aunt   . Heart failure Paternal Uncle   . Healthy Brother     x2  . Healthy Sister     x3  . Healthy Daughter     x1  . Allergies Daughter     History   Social History  . Marital Status: Married    Spouse Name: N/A    Number of Children: N/A  . Years of Education: N/A   Social History Main Topics  . Smoking status: Current Every Day Smoker -- 0.50 packs/day for 15 years  . Smokeless tobacco: Never Used  . Alcohol Use: 0.0 oz/week    1-2 Cans of beer per week     Comment: occasionally   . Drug Use: No  . Sexual Activity: No   Other Topics Concern  . None   Social History Narrative  . None  Review of Systems - See HPI.  All other ROS are negative.  BP 125/79  Pulse 69  Temp(Src) 98.4 F (36.9 C) (Oral)  Resp 16  Ht 5\' 10"  (1.778 m)  Wt 195 lb 4 oz (88.565 kg)  BMI 28.02 kg/m2  SpO2 98%  Physical Exam  Vitals reviewed. Constitutional: He is oriented to person, place, and time and well-developed, well-nourished, and in no distress.  HENT:  Head: Normocephalic and atraumatic.  Eyes: Conjunctivae and EOM are normal. Pupils are equal, round, and reactive to light.  Neck: Neck supple.  Cardiovascular: Normal rate, regular rhythm, normal heart sounds and intact distal pulses.   Pulmonary/Chest: Effort normal and breath sounds normal. No respiratory distress. He has no wheezes. He has no rales. He exhibits no tenderness.   Neurological: He is alert and oriented to person, place, and time.  Skin: Skin is warm and dry. No rash noted.  Psychiatric: Mood, memory and judgment normal.  Anxious and tearful affect.    Recent Results (from the past 2160 hour(s))  CBC     Status: None   Collection Time    03/18/14  8:10 AM      Result Value Ref Range   WBC 9.4  4.0 - 10.5 K/uL   RBC 4.78  4.22 - 5.81 MIL/uL   Hemoglobin 15.0  13.0 - 17.0 g/dL   HCT 45.443.3  09.839.0 - 11.952.0 %   MCV 90.6  78.0 - 100.0 fL   MCH 31.4  26.0 - 34.0 pg   MCHC 34.6  30.0 - 36.0 g/dL   RDW 14.713.2  82.911.5 - 56.215.5 %   Platelets 287  150 - 400 K/uL  COMPREHENSIVE METABOLIC PANEL     Status: Abnormal   Collection Time    03/18/14  8:10 AM      Result Value Ref Range   Sodium 142  135 - 145 mEq/L   Potassium 4.9  3.5 - 5.3 mEq/L   Chloride 106  96 - 112 mEq/L   CO2 28  19 - 32 mEq/L   Glucose, Bld 64 (*) 70 - 99 mg/dL   BUN 13  6 - 23 mg/dL   Creat 1.301.09  8.650.50 - 7.841.35 mg/dL   Total Bilirubin 0.3  0.2 - 1.2 mg/dL   Alkaline Phosphatase 58  39 - 117 U/L   AST 20  0 - 37 U/L   ALT 15  0 - 53 U/L   Total Protein 6.8  6.0 - 8.3 g/dL   Albumin 4.4  3.5 - 5.2 g/dL   Calcium 9.8  8.4 - 69.610.5 mg/dL  TSH     Status: None   Collection Time    03/18/14  8:10 AM      Result Value Ref Range   TSH 1.602  0.350 - 4.500 uIU/mL  URINALYSIS, ROUTINE W REFLEX MICROSCOPIC     Status: None   Collection Time    03/18/14  8:10 AM      Result Value Ref Range   Color, Urine TEST NOT PERFORMED  YELLOW   Comment: Test request received without appropriate specimen.   APPearance TEST NOT PERFORMED  CLEAR   Specific Gravity, Urine TEST NOT PERFORMED  1.005 - 1.030   pH TEST NOT PERFORMED  5.0 - 8.0   Glucose, UA TEST NOT PERFORMED  NEG mg/dL   Bilirubin Urine TEST NOT PERFORMED  NEG   Ketones, ur TEST NOT PERFORMED  NEG mg/dL   Hgb urine dipstick TEST  NOT PERFORMED  NEG   Protein, ur TEST NOT PERFORMED  NEG mg/dL   Urobilinogen, UA TEST NOT PERFORMED  0.0 - 1.0  mg/dL   Nitrite TEST NOT PERFORMED  NEG   Leukocytes, UA TEST NOT PERFORMED  NEG  LIPID PANEL     Status: Abnormal   Collection Time    03/18/14  8:10 AM      Result Value Ref Range   Cholesterol 171  0 - 200 mg/dL   Comment: ATP III Classification:           < 200        mg/dL        Desirable          200 - 239     mg/dL        Borderline High          >= 240        mg/dL        High         Triglycerides 127  <150 mg/dL   HDL 46  >40>39 mg/dL   Total CHOL/HDL Ratio 3.7     VLDL 25  0 - 40 mg/dL   LDL Cholesterol 981100 (*) 0 - 99 mg/dL   Comment:       Total Cholesterol/HDL Ratio:CHD Risk                            Coronary Heart Disease Risk Table                                            Men       Women              1/2 Average Risk              3.4        3.3                  Average Risk              5.0        4.4               2X Average Risk              9.6        7.1               3X Average Risk             23.4       11.0     Use the calculated Patient Ratio above and the CHD Risk table      to determine the patient's CHD Risk.     ATP III Classification (LDL):           < 100        mg/dL         Optimal          100 - 129     mg/dL         Near or Above Optimal          130 - 159     mg/dL         Borderline High          160 - 189  mg/dL         High           > 190        mg/dL         Very High        TESTOSTERONE, FREE, TOTAL     Status: None   Collection Time    03/18/14  8:10 AM      Result Value Ref Range   Testosterone 322  300 - 890 ng/dL   Comment:           Tanner Stage       Male              Male                   I              < 30 ng/dL        < 10 ng/dL                   II             < 150 ng/dL       < 30 ng/dL                   III            100-320 ng/dL     < 35 ng/dL                   IV             200-970 ng/dL     16-10 ng/dL                   V/Adult        300-890 ng/dL     96-04 ng/dL         Sex Hormone Binding 22  13 - 71  nmol/L   Testosterone, Free 79.1  47.0 - 244.0 pg/mL   Comment:       The concentration of free testosterone is derived from a mathematical     expression based on constants for the binding of testosterone to sex     hormone-binding globulin and albumin.   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 Will Rx Percocet to get adequate relief until patient can have his steroid injection.  Patient to use Percocet sparingly. No additional refills without patient following up for injection with specialist  Acute stress reaction Due to grief over her loss of loved one. Anxiety symptoms predominate. Rx Xanax 1 mg 3 times daily. Patient given handout for counseling services. Patient is to set up with a counselor within [place].

## 2014-04-07 NOTE — Assessment & Plan Note (Signed)
Continue gabapentin. Will Rx Percocet to get adequate relief until patient can have his steroid injection.  Patient to use Percocet sparingly. No additional refills without patient following up for injection with specialist

## 2014-04-17 ENCOUNTER — Ambulatory Visit: Payer: BC Managed Care – PPO | Admitting: Physician Assistant

## 2014-04-22 ENCOUNTER — Ambulatory Visit: Payer: BC Managed Care – PPO | Admitting: Physician Assistant

## 2014-04-22 DIAGNOSIS — Z0289 Encounter for other administrative examinations: Secondary | ICD-10-CM

## 2014-04-28 ENCOUNTER — Telehealth: Payer: Self-pay | Admitting: Physician Assistant

## 2014-04-28 DIAGNOSIS — M545 Low back pain, unspecified: Secondary | ICD-10-CM

## 2014-04-28 DIAGNOSIS — F430 Acute stress reaction: Secondary | ICD-10-CM

## 2014-04-28 DIAGNOSIS — G8929 Other chronic pain: Secondary | ICD-10-CM

## 2014-04-28 NOTE — Telephone Encounter (Signed)
Please advise refills?  Last RX of Alprazolam was done on 04-06-14 quantity 45 with 0 refills Last RX of Oxy was done on 04-06-14 quantity 40 with 0 refills

## 2014-04-28 NOTE — Telephone Encounter (Signed)
Patient is requesting a new rx of alprazolam and oxy

## 2014-04-29 NOTE — Telephone Encounter (Signed)
Medication refill is not due until next Wednesday 04/05/14. He can pick up the Rx as early as next Monday but no sooner.

## 2014-05-01 MED ORDER — OXYCODONE-ACETAMINOPHEN 7.5-325 MG PO TABS: 1.0000 | ORAL_TABLET | Freq: Three times a day (TID) | ORAL | Status: DC | PRN

## 2014-05-01 MED ORDER — ALPRAZOLAM 2 MG PO TABS: 1.0000 mg | ORAL_TABLET | Freq: Three times a day (TID) | ORAL | Status: DC | PRN

## 2014-05-01 NOTE — Telephone Encounter (Signed)
Attempt to reach pt via phone, number states "disconnected or no longer in service"; called pt's spouse, Shanda Bumps [phone] and [facility] with contact name & number, that we were returning call from patient RE: medication refills & gave her provider instructions that the requested Rxs can be p/u no earlier than Monday; Rxs printed & signed/SLS

## 2014-05-04 ENCOUNTER — Telehealth: Payer: Self-pay | Admitting: Physician Assistant

## 2014-05-04 MED ORDER — VARENICLINE TARTRATE 1 MG PO TABS: 1.0000 mg | ORAL_TABLET | Freq: Two times a day (BID) | ORAL | Status: DC

## 2014-05-04 NOTE — Telephone Encounter (Signed)
Caller name: Lawence  Call back number:[phone] Pharmacy:  Reason for call: pt needs refill on RX varenicline (CHANTIX STARTING MONTH PAK) 0.5 MG X 11 & 1 MG X 42 tablet and alprazolam (XANAX) 2 MG tablet and oxyCODONE-acetaminophen (PERCOCET) 7.5-325 MG per tablet .  Pt is leaving to go out of town today at 5 and would like to have these printed so he can pick them up.  Please contact pt to advise

## 2014-05-04 NOTE — Telephone Encounter (Signed)
Spoke with patient at new phone number, updated in demographics; pt is aware that Percocet & Alprazolam scripts are ready for p/u at [facility] office and request for continuation pack of Chantix has been sent to pharmacy/SLS

## 2014-05-04 NOTE — Telephone Encounter (Signed)
Spoke with patient at new phone number, updated in demographics; pt is aware that Percocet & Alprazolam scripts are ready for p/u at [place] office and request for continuation pack of Chantix has been sent to pharmacy/SLS  

## 2014-05-18 ENCOUNTER — Ambulatory Visit: Payer: BC Managed Care – PPO | Admitting: Physician Assistant

## 2014-06-03 ENCOUNTER — Telehealth: Payer: Self-pay | Admitting: Physician Assistant

## 2014-06-03 DIAGNOSIS — Z720 Tobacco use: Secondary | ICD-10-CM

## 2014-06-03 DIAGNOSIS — I10 Essential (primary) hypertension: Secondary | ICD-10-CM

## 2014-06-03 MED ORDER — LISINOPRIL 10 MG PO TABS: 10.0000 mg | ORAL_TABLET | Freq: Every day | ORAL | Status: DC

## 2014-06-03 MED ORDER — VARENICLINE TARTRATE 1 MG PO TABS: 1.0000 mg | ORAL_TABLET | Freq: Two times a day (BID) | ORAL | Status: DC

## 2014-06-03 NOTE — Telephone Encounter (Signed)
Patient is requesting a refill of lisinopril, chantix, alpraloxam, and oxy.

## 2014-06-03 NOTE — Telephone Encounter (Signed)
Lisinopril and Chantix reordered.  This is the last Rx for Chantix.  No refills of Oxycodone or Xanax without follow-up appointment.

## 2014-06-04 ENCOUNTER — Telehealth: Payer: Self-pay | Admitting: Physician Assistant

## 2014-06-04 NOTE — Telephone Encounter (Signed)
Relevant patient education assigned to patient using Emmi. ° °

## 2014-06-10 ENCOUNTER — Ambulatory Visit: Payer: BC Managed Care – PPO | Admitting: Physician Assistant

## 2014-06-10 DIAGNOSIS — Z0289 Encounter for other administrative examinations: Secondary | ICD-10-CM

## 2014-06-18 ENCOUNTER — Encounter: Payer: Self-pay | Admitting: Physician Assistant

## 2014-06-18 ENCOUNTER — Ambulatory Visit (INDEPENDENT_AMBULATORY_CARE_PROVIDER_SITE_OTHER): Payer: BC Managed Care – PPO | Admitting: Physician Assistant

## 2014-06-18 ENCOUNTER — Telehealth: Payer: Self-pay | Admitting: Physician Assistant

## 2014-06-18 VITALS — BP 132/84 | HR 85 | Temp 98.4°F | Resp 16 | Ht 70.0 in | Wt 170.2 lb

## 2014-06-18 DIAGNOSIS — M5440 Lumbago with sciatica, unspecified side: Secondary | ICD-10-CM

## 2014-06-18 DIAGNOSIS — G8929 Other chronic pain: Secondary | ICD-10-CM

## 2014-06-18 DIAGNOSIS — M545 Low back pain, unspecified: Secondary | ICD-10-CM

## 2014-06-18 DIAGNOSIS — M5430 Sciatica, unspecified side: Secondary | ICD-10-CM

## 2014-06-18 MED ORDER — OXYCODONE-ACETAMINOPHEN 7.5-325 MG PO TABS: 1.0000 | ORAL_TABLET | Freq: Three times a day (TID) | ORAL | Status: DC | PRN

## 2014-06-18 NOTE — Progress Notes (Signed)
Pre visit review using our clinic review tool, if applicable. No additional management support is needed unless otherwise documented below in the visit note/SLS  

## 2014-06-18 NOTE — Telephone Encounter (Signed)
Relevant patient education mailed to patient.  

## 2014-06-18 NOTE — Progress Notes (Signed)
Patient presents to clinic today c/o for medication management.  Patient requesting refills of his Percocet. Patient still unable to reschedule steroid injection with Neurosurgery due to recent incarceration for parole violation.  This provider was previously unaware of patient's prison history. Patient endorses continued significant low back pain with occasional radiation to LLE.  Denies new or worsening symptom.  Still denies saddle anesthesia.  Past Medical History  Diagnosis Date  . Chicken pox   . Multiple gastric ulcers   . Hypertension     Current Outpatient Prescriptions on File Prior to Visit  Medication Sig Dispense Refill  . alprazolam (XANAX) 2 MG tablet Take 0.5 tablets (1 mg total) by mouth 3 (three) times daily as needed for anxiety. Use sparingly.  45 tablet  0  . cyclobenzaprine (FLEXERIL) 5 MG tablet Take 1 tablet (5 mg total) by mouth 3 (three) times daily as needed for muscle spasms.  30 tablet  1  . gabapentin (NEURONTIN) 100 MG capsule Take 1 capsule (100 mg total) by mouth 3 (three) times daily.  90 capsule  0  . lisinopril (PRINIVIL,ZESTRIL) 10 MG tablet Take 1 tablet (10 mg total) by mouth daily.  30 tablet  2  . Multiple Vitamin (MULTIVITAMIN) tablet Take 1 tablet by mouth daily.       . varenicline (CHANTIX CONTINUING MONTH PAK) 1 MG tablet Take 1 tablet (1 mg total) by mouth 2 (two) times daily.  60 tablet  0   No current facility-administered medications on file prior to visit.    No Known Allergies  Family History  Problem Relation Age of Onset  . Arthritis Father     Living  . Other Father     Lung Mass  . Heart disease Father   . Heart attack Father   . Arthritis Paternal Grandfather   . Arthritis Paternal Grandmother   . Heart disease Paternal Grandmother   . Stroke Paternal Grandmother   . Heart disease Paternal Grandfather   . Alzheimer's disease Paternal Grandfather   . Hypertension Paternal Grandfather   . Hypertension Paternal Grandmother    . Diabetes Paternal Grandfather   . Heart failure Paternal Aunt   . Heart failure Paternal Uncle   . Healthy Brother     x2  . Healthy Sister     x3  . Healthy Daughter     x1  . Allergies Daughter     History   Social History  . Marital Status: Married    Spouse Name: N/A    Number of Children: N/A  . Years of Education: N/A   Social History Main Topics  . Smoking status: Current Every Day Smoker -- 0.50 packs/day for 15 years  . Smokeless tobacco: Never Used  . Alcohol Use: 0.0 oz/week    1-2 Cans of beer per week     Comment: occasionally   . Drug Use: No  . Sexual Activity: No   Other Topics Concern  . None   Social History Narrative  . None   Review of Systems - See HPI.  All other ROS are negative.  BP 132/84  Pulse 85  Temp(Src) 98.4 F (36.9 C) (Oral)  Resp 16  Ht 5\' 10"  (1.778 m)  Wt 170 lb 4 oz (77.225 kg)  BMI 24.43 kg/m2  SpO2 98%  Physical Exam  Vitals reviewed. Constitutional: He is oriented to person, place, and time and well-developed, well-nourished, and in no distress.  Cardiovascular: Normal rate, regular rhythm, normal heart sounds  and intact distal pulses.   Pulmonary/Chest: Effort normal and breath sounds normal. No respiratory distress. He has no wheezes. He has no rales. He exhibits no tenderness.  Musculoskeletal:       Lumbar back: He exhibits tenderness and pain. He exhibits normal range of motion and no bony tenderness.  Neurological: He is alert and oriented to person, place, and time.  Skin: Skin is warm and dry. No rash noted.   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  Patient given a couple day supply of medication to alleviate pain until testing can be completed.  Patient is aware if he tests positive for any substance he is not prescribed, he will no longer be granted controlled substances from our practice.   Patient also to call and reschedule appointment with Neurosurgeon.

## 2014-06-18 NOTE — Assessment & Plan Note (Signed)
Will not grant full refill of Percocet with patient subjecting to urine drug screening.  Patient given a couple day supply of medication to alleviate pain until testing can be completed.  Patient is aware if he tests positive for any substance he is not prescribed, he will no longer be granted controlled substances from our practice.  Patient also to call and reschedule appointment with Neurosurgeon.

## 2014-06-18 NOTE — Patient Instructions (Signed)
Please call Neurosurgeon to reschedule your injection.   Take medication as prescribed.  I will give you a full refill once I have your urine drug screen results.   I am glad you are doing better.

## 2014-06-19 ENCOUNTER — Telehealth: Payer: Self-pay | Admitting: Physician Assistant

## 2014-06-19 DIAGNOSIS — Z0283 Encounter for blood-alcohol and blood-drug test: Secondary | ICD-10-CM

## 2014-06-19 NOTE — Telephone Encounter (Signed)
Patient called in wanting results from yesterday labs and wanting to know if rx was ready for pickup.

## 2014-06-19 NOTE — Telephone Encounter (Signed)
Patient presented for Urine specimen to be given under healthcare professional observation d/t prior specimen failing color & temperature test per Protocol/SLS

## 2014-06-20 LAB — DRUG SCREEN, URINE
AMPHETAMINE SCRN UR: NEGATIVE
BARBITURATE QUANT UR: NEGATIVE
Benzodiazepines.: POSITIVE — AB
COCAINE METABOLITES: NEGATIVE
Creatinine,U: 218.05 mg/dL
MARIJUANA METABOLITE: POSITIVE — AB
Methadone: POSITIVE — AB
Opiates: POSITIVE — AB
Phencyclidine (PCP): NEGATIVE
Propoxyphene: NEGATIVE

## 2014-06-22 ENCOUNTER — Telehealth: Payer: Self-pay | Admitting: Physician Assistant

## 2014-06-22 DIAGNOSIS — Z0283 Encounter for blood-alcohol and blood-drug test: Secondary | ICD-10-CM

## 2014-06-22 NOTE — Telephone Encounter (Signed)
Order placed

## 2014-06-22 NOTE — Telephone Encounter (Signed)
Patient informed, understood & agreed; he will go to [facility] for blood test/SLS

## 2014-06-22 NOTE — Telephone Encounter (Signed)
Patient's UDS results are in.  Shows he is taking his Xanax and Percocet.  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  Patient did not elicit that he has been given this medication.  Cannot refill patient's medications, unless he states he is not taking medication and is willing to submit to a blood test.  Also patient will have to sign a controlled substance contract with us before being able to get medications.

## 2014-06-23 ENCOUNTER — Encounter: Payer: Self-pay | Admitting: *Deleted

## 2014-06-24 ENCOUNTER — Telehealth: Payer: Self-pay | Admitting: Physician Assistant

## 2014-06-24 NOTE — Telephone Encounter (Signed)
Patient calling again regarding this.

## 2014-06-24 NOTE — Telephone Encounter (Signed)
Patient informed that his lab result are not expected in system until Friday and that we will call him as soon as they are resulted & also his receiving of medication will be dependent on the results [last results were abnormal]; pt understood/SLS

## 2014-06-24 NOTE — Telephone Encounter (Signed)
He just had test last night.  His results are not available.  As stated before, he will be contacted as soon as results are in.

## 2014-06-24 NOTE — Telephone Encounter (Signed)
Calling requesting results of lab work, awaiting results for prescription

## 2014-06-26 NOTE — Telephone Encounter (Signed)
Patient returned phone call. I informed him of what Malva CoganCody Martin PA-C states

## 2014-06-26 NOTE — Telephone Encounter (Signed)
Results are not in.  I am calling the lab right now to see if they have the results but have not sent them over yet.  I am sorry, but I cannot refill medications until he passes his serum drug test as he did not pass his urine test.

## 2014-06-26 NOTE — Telephone Encounter (Signed)
Pt called back about this. States his is working in [employer] and lives in [place]. Can't afford to come back to [place].  Requesting refill on his anxiety medication today. Report 2 recent "full blown anxiety attacks." Would like call back this afternoon.

## 2014-06-26 NOTE — Telephone Encounter (Signed)
There is not much I can do until his lab results.  I am sorry but this is policy.  He cannot receive controlled substances from our office until this test comes back clear of narcotics he is not prescribed.  I recommend that he try to remain calm and avoid unnecessarily stressful situations.  There are other medications that can be started, but will take several days to go into effect.  Again I will keep an eye on his results.  I recommend if anxiety is significant, he should proceed to an Urgent Care or ER facility.

## 2014-06-26 NOTE — Telephone Encounter (Signed)
Please call patient. [facility] with [facility].  Lab is still pending.  Please inform patient I will keep my eye out for this.  I am leaving today at 3 due to a family member's illness, but I will make the other providers aware to keep a look out for this.

## 2014-06-26 NOTE — Telephone Encounter (Signed)
Patient showed up to clinic requesting medication.  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

## 2014-06-26 NOTE — Telephone Encounter (Signed)
Attempted to call pt back but was unsuccessful.

## 2014-06-26 NOTE — Telephone Encounter (Signed)
Patient calling back stating that he has had several anxiety attacks and wanted to know what he should do

## 2014-06-26 NOTE — Telephone Encounter (Signed)
Voicemail not set up.

## 2014-06-27 ENCOUNTER — Encounter: Payer: Self-pay | Admitting: Physician Assistant

## 2014-06-29 NOTE — Telephone Encounter (Signed)
Provider spoke with Tracy York this morning and was informed that results would not be available until Wed, 07.29.15; pt informed via MyChart/SLS

## 2014-07-01 ENCOUNTER — Other Ambulatory Visit: Payer: Self-pay | Admitting: Physician Assistant

## 2014-07-01 ENCOUNTER — Telehealth: Payer: Self-pay | Admitting: Physician Assistant

## 2014-07-01 DIAGNOSIS — F430 Acute stress reaction: Secondary | ICD-10-CM

## 2014-07-01 DIAGNOSIS — G8929 Other chronic pain: Secondary | ICD-10-CM

## 2014-07-01 LAB — DRUG SCREEN PANEL (SERUM)

## 2014-07-01 MED ORDER — ALPRAZOLAM 2 MG PO TABS
1.0000 mg | ORAL_TABLET | Freq: Three times a day (TID) | ORAL | Status: DC | PRN
Start: 2014-07-01 — End: 2014-07-01

## 2014-07-01 MED ORDER — OXYCODONE-ACETAMINOPHEN 7.5-325 MG PO TABS: 1.0000 | ORAL_TABLET | Freq: Three times a day (TID) | ORAL | Status: DC | PRN

## 2014-07-01 MED ORDER — ALPRAZOLAM 2 MG PO TABS: 1.0000 mg | ORAL_TABLET | Freq: Three times a day (TID) | ORAL | Status: DC | PRN

## 2014-07-01 NOTE — Telephone Encounter (Signed)
Fax received with pt's results, everything Ok per provider; Rx[s] printed and patient informed ready for pick-up/SLS

## 2014-07-01 NOTE — Telephone Encounter (Signed)
Patient's serum drug screen is negative for methadone initially found on urine drug screen. Medications refilled and ready for pickup.  Again, reiterate to patient that he is always subject to random drug screens as part of his contract.  He will also need to follow-up with his specialist and I will not be giving chronic narcotic pain medications.

## 2014-07-01 NOTE — Telephone Encounter (Signed)
Regis BillSharon L Akiem Urieta, CMA at 07/01/2014 10:59 AM    Status: Signed       Fax received with pt's results, everything Ok per provider; Rx[s] printed and patient informed ready for pick-up/SLS       Regis BillSharon L Barton Want, CMA at 07/01/2014 8:04 AM     Status: Signed        Had [facility] lab tech check on why results have not as yet been received; informed Marjuana tested positive and that another red-top tube would have been needed for re-testing, told to call [facility] and request either a QNS or Positive for results. Spoke with Customer Service and explained that we needed this request and that we needed other results faxed over to office ASAP, as a patient receiving his medication was dependent upon this; stated she would send this information over right away. Awaiting faxed results/SLS        Regis BillSharon L Laneshia Pina, CMA at 06/29/2014 1:41 PM     Status: Signed        [facility] this [facility] and was informed that results would not be available until Wed, 07.29.15; pt informed via MyChart/SLS

## 2014-07-01 NOTE — Telephone Encounter (Signed)
Had [facility] lab tech check on why results have not as yet been received; informed Tracy York tested positive and that another red-top tube would have been needed for re-testing, told to call [facility] and request either a QNS or Positive for results. Spoke with Customer Service and explained that we needed this request and that we needed other results faxed over to office ASAP, as a patient receiving his medication was dependent upon this; stated she would send this information over right away. Awaiting faxed results/SLS

## 2014-07-13 ENCOUNTER — Emergency Department (HOSPITAL_COMMUNITY)
Admission: EM | Admit: 2014-07-13 | Discharge: 2014-07-13 | Disposition: A | Discharge status: Home / Self Care | Payer: Self-pay | Attending: Emergency Medicine | Admitting: Emergency Medicine

## 2014-07-13 ENCOUNTER — Encounter (HOSPITAL_COMMUNITY): Payer: Self-pay | Admitting: Emergency Medicine

## 2014-07-13 ENCOUNTER — Emergency Department (HOSPITAL_COMMUNITY): Payer: Self-pay

## 2014-07-13 DIAGNOSIS — S6980XA Other specified injuries of unspecified wrist, hand and finger(s), initial encounter: Secondary | ICD-10-CM | POA: Insufficient documentation

## 2014-07-13 DIAGNOSIS — W1809XA Striking against other object with subsequent fall, initial encounter: Secondary | ICD-10-CM | POA: Insufficient documentation

## 2014-07-13 DIAGNOSIS — Z79899 Other long term (current) drug therapy: Secondary | ICD-10-CM | POA: Insufficient documentation

## 2014-07-13 DIAGNOSIS — S6990XA Unspecified injury of unspecified wrist, hand and finger(s), initial encounter: Secondary | ICD-10-CM | POA: Insufficient documentation

## 2014-07-13 DIAGNOSIS — Z8619 Personal history of other infectious and parasitic diseases: Secondary | ICD-10-CM | POA: Insufficient documentation

## 2014-07-13 DIAGNOSIS — Z8719 Personal history of other diseases of the digestive system: Secondary | ICD-10-CM | POA: Insufficient documentation

## 2014-07-13 DIAGNOSIS — Y9389 Activity, other specified: Secondary | ICD-10-CM | POA: Insufficient documentation

## 2014-07-13 DIAGNOSIS — Y9289 Other specified places as the place of occurrence of the external cause: Secondary | ICD-10-CM | POA: Insufficient documentation

## 2014-07-13 DIAGNOSIS — F17200 Nicotine dependence, unspecified, uncomplicated: Secondary | ICD-10-CM | POA: Insufficient documentation

## 2014-07-13 DIAGNOSIS — S63619A Unspecified sprain of unspecified finger, initial encounter: Secondary | ICD-10-CM

## 2014-07-13 DIAGNOSIS — W230XXA Caught, crushed, jammed, or pinched between moving objects, initial encounter: Secondary | ICD-10-CM | POA: Insufficient documentation

## 2014-07-13 DIAGNOSIS — I10 Essential (primary) hypertension: Secondary | ICD-10-CM | POA: Insufficient documentation

## 2014-07-13 DIAGNOSIS — S6390XA Sprain of unspecified part of unspecified wrist and hand, initial encounter: Secondary | ICD-10-CM | POA: Insufficient documentation

## 2014-07-13 MED ORDER — OXYCODONE-ACETAMINOPHEN 5-325 MG PO TABS
1.0000 | ORAL_TABLET | Freq: Once | ORAL | Status: AC
  Administered 2014-07-13: 1 via ORAL
  Filled 2014-07-13: qty 1

## 2014-07-13 NOTE — Discharge Instructions (Signed)
Finger Sprain  A finger sprain is a tear in one of the strong, fibrous tissues that connect the bones (ligaments) in your finger. The severity of the sprain depends on how much of the ligament is torn. The tear can be either partial or complete.  CAUSES   Often, sprains are a result of a fall or accident. If you extend your hands to catch an object or to protect yourself, the force of the impact causes the fibers of your ligament to stretch too much. This excess tension causes the fibers of your ligament to tear.  SYMPTOMS   You may have some loss of motion in your finger. Other symptoms include:   Bruising.   Tenderness.   Swelling.  DIAGNOSIS   In order to diagnose finger sprain, your caregiver will physically examine your finger or thumb to determine how torn the ligament is. Your caregiver may also suggest an X-ray exam of your finger to make sure no bones are broken.  TREATMENT   If your ligament is only partially torn, treatment usually involves keeping the finger in a fixed position (immobilization) for a short period. To do this, your caregiver will apply a bandage, cast, or splint to keep your finger from moving until it heals. For a partially torn ligament, the healing process usually takes 2 to 3 weeks.  If your ligament is completely torn, you may need surgery to reconnect the ligament to the bone. After surgery a cast or splint will be applied and will need to stay on your finger or thumb for 4 to 6 weeks while your ligament heals.  HOME CARE INSTRUCTIONS   Keep your injured finger elevated, when possible, to decrease swelling.   To ease pain and swelling, apply ice to your joint twice a day, for 2 to 3 days:   Put ice in a plastic bag.   Place a towel between your skin and the bag.   Leave the ice on for 15 minutes.   Only take over-the-counter or prescription medicine for pain as directed by your caregiver.   Do not wear rings on your injured finger.   Do not leave your finger unprotected  until pain and stiffness go away (usually 3 to 4 weeks).   Do not allow your cast or splint to get wet. Cover your cast or splint with a plastic bag when you shower or bathe. Do not swim.   Your caregiver may suggest special exercises for you to do during your recovery to prevent or limit permanent stiffness.  SEEK IMMEDIATE MEDICAL CARE IF:   Your cast or splint becomes damaged.   Your pain becomes worse rather than better.  MAKE SURE YOU:   Understand these instructions.   Will watch your condition.   Will get help right away if you are not doing well or get worse.  Document Released: 12/28/2004 Document Revised: 02/12/2012 Document Reviewed: 07/24/2011  Exit[employer] Patient Information 2015 [employer]. This information is not intended to replace advice given to you by your health care provider. Make sure you discuss any questions you have with your health care provider.

## 2014-07-13 NOTE — ED Notes (Signed)
Declined W/C at D/C and was escorted to lobby by RN. 

## 2014-07-13 NOTE — ED Notes (Signed)
Pt reports was loading wood onto truck smashed his left pinky under 2 pieces of wood. Can move finger, left hand is warm, sensation intact.

## 2014-07-13 NOTE — ED Provider Notes (Signed)
CSN: [id number]     Arrival date & time 07/13/14  1813 History  This chart was scribed for Terri Piedra, PA-C working with Ethelda Chick, MD by Evon Slack, ED Scribe. This patient was seen in room TR08C/TR08C and the patient's care was started at 8:04 PM.    Chief Complaint  Patient presents with  . Finger Injury   The history is provided by the patient. No language interpreter was used.   HPI Comments: Tracy York is a 28 y.o. male who presents to the Emergency Department complaining of throbbing left 5th digit finger injury onset 12:30PM. He states that it is some swelling to the finger.  He states that he has taken oxycodone with some relief.  Patient is a member of a pain clinic. He states that he has been applying ice with some relief as well. He states that he smashed the finger while loading wood onto his truck.  Denies numbness or tingling.   Past Medical History  Diagnosis Date  . Chicken pox   . Multiple gastric ulcers   . Hypertension    Past Surgical History  Procedure Laterality Date  . Appendectomy  2008  . Wisdom tooth extraction  2009   Family History  Problem Relation Age of Onset  . Arthritis Father     Living  . Other Father     Lung Mass  . Heart disease Father   . Heart attack Father   . Arthritis Paternal Grandfather   . Arthritis Paternal Grandmother   . Heart disease Paternal Grandmother   . Stroke Paternal Grandmother   . Heart disease Paternal Grandfather   . Alzheimer's disease Paternal Grandfather   . Hypertension Paternal Grandfather   . Hypertension Paternal Grandmother   . Diabetes Paternal Grandfather   . Heart failure Paternal Aunt   . Heart failure Paternal Uncle   . Healthy Brother     x2  . Healthy Sister     x3  . Healthy Daughter     x1  . Allergies Daughter    History  Substance Use Topics  . Smoking status: Current Every Day Smoker -- 0.50 packs/day for 15 years  . Smokeless tobacco: Never Used  . Alcohol Use:  0.0 oz/week    1-2 Cans of beer per week     Comment: occasionally     Review of Systems  Musculoskeletal: Positive for arthralgias and joint swelling.  Neurological: Negative for numbness.    Allergies  Review of patient's allergies indicates no known allergies.  Home Medications   Prior to Admission medications   Medication Sig Start Date End Date Taking? Authorizing Provider  alprazolam Prudy Feeler) 2 MG tablet Take 0.5 tablets (1 mg total) by mouth 3 (three) times daily as needed for anxiety. Use sparingly. 07/01/14   Waldon Merl, PA-C  cyclobenzaprine (FLEXERIL) 5 MG tablet Take 1 tablet (5 mg total) by mouth 3 (three) times daily as needed for muscle spasms. 01/05/14   Waldon Merl, PA-C  gabapentin (NEURONTIN) 100 MG capsule Take 1 capsule (100 mg total) by mouth 3 (three) times daily. 03/18/14   Waldon Merl, PA-C  lisinopril (PRINIVIL,ZESTRIL) 10 MG tablet Take 1 tablet (10 mg total) by mouth daily. 06/03/14   Waldon Merl, PA-C  Multiple Vitamin (MULTIVITAMIN) tablet Take 1 tablet by mouth daily.     Historical Provider, MD  oxyCODONE-acetaminophen (PERCOCET) 7.5-325 MG per tablet Take 1 tablet by mouth every 8 (eight) hours as needed for  pain. 07/01/14   Waldon MerlWilliam C Martin, PA-C  varenicline (CHANTIX CONTINUING MONTH PAK) 1 MG tablet Take 1 tablet (1 mg total) by mouth 2 (two) times daily. 06/03/14   Waldon MerlWilliam C Martin, PA-C   Triage Vitals: BP 117/67  Pulse 83  Temp(Src) 98.1 F (36.7 C) (Oral)  Resp 18  Ht 5\' 10"  (1.778 m)  Wt 175 lb (79.379 kg)  BMI 25.11 kg/m2  SpO2 97%  Physical Exam  Nursing note and vitals reviewed. Constitutional: He is oriented to person, place, and time. He appears well-developed and well-nourished. No distress.  HENT:  Head: Normocephalic and atraumatic.  Mouth/Throat: Oropharynx is clear and moist. No oropharyngeal exudate.  Eyes: Conjunctivae are normal. No scleral icterus.  Neck: Normal range of motion. Neck supple. No JVD present. No  thyromegaly present.  Cardiovascular: Normal rate, regular rhythm, normal heart sounds and intact distal pulses.  Exam reveals no gallop and no friction rub.   No murmur heard. Pulmonary/Chest: Effort normal and breath sounds normal. No respiratory distress. He has no wheezes. He has no rales. He exhibits no tenderness.  Musculoskeletal:       Left hand: He exhibits swelling. He exhibits normal range of motion, no tenderness, no bony tenderness, normal two-point discrimination, normal capillary refill, no deformity and no laceration. Normal sensation noted. Decreased sensation is not present in the ulnar distribution, is not present in the medial redistribution and is not present in the radial distribution. Decreased strength noted. He exhibits no finger abduction, no thumb/finger opposition and no wrist extension trouble.  Mild swelling of the PIP of the left pinky finger.  There is 5/5 flexion in the DIP and PIP joints and 5/5 extension in the DIP and PIP joints.  There is no sensory deficit of the left pinky finger.  There is mild malalignment of the pinky finger with fingers in fist.    Lymphadenopathy:    He has no cervical adenopathy.  Neurological: He is alert and oriented to person, place, and time.  Skin: Skin is warm and dry. He is not diaphoretic.  Psychiatric: He has a normal mood and affect. His behavior is normal. Judgment and thought content normal.    ED Course  Procedures (including critical care time) DIAGNOSTIC STUDIES: Oxygen Saturation is 97% on RA, normal by my interpretation.    COORDINATION OF CARE: 8:36 PM-Discussed treatment plan which includes finger splint and orthopedic referral  with pt at bedside and pt agreed to plan.     Labs Review Labs Reviewed - No data to display  Imaging Review Dg Hand Complete Left  07/13/2014   CLINICAL DATA:  Finger injury, pain, and deformity.  EXAM: LEFT HAND - COMPLETE 3+ VIEW  COMPARISON:  06/27/2007  FINDINGS: There is no  evidence of acute fracture or dislocation. There is no evidence of arthropathy or other focal bone abnormality. Persistent flexion deformity seen at the PIP joint of the little finger, without osseous abnormality. No radiopaque foreign body identified Soft tissues are unremarkable.  IMPRESSION: No evidence of acute osseous abnormality or radiopaque foreign body.  Flexion deformity of the little finger at PIP joint ; ligamentous injury cannot be excluded.   Electronically Signed   By: Myles RosenthalJohn  Stahl M.D.   On: 07/13/2014 19:15     EKG Interpretation None      MDM   Final diagnoses:  Sprain of finger of left hand, initial encounter    Patient is a 28 y.o. Male who presents to the ED with  left pinky pain.  Xrays here are negfative for fracture at this time.  There is mild malalignment of the pinky finger with fist.  Will place finger splint in splint at this time and will have him follow-up with Dr. Merlyn Lot.  Full tendon flexion and extension at both the DIP and PIP at this time.  Finger is neurovascularly intact.  Patient was told to return for septic joint symptoms.  He states understanding and agreement at this time.     I personally performed the services described in this documentation, which was scribed in my presence. The recorded information has been reviewed and is accurate.      Eben Burow, PA-C 07/14/14 [phone]

## 2014-07-14 NOTE — ED Provider Notes (Signed)
Medical screening examination/treatment/procedure(s) were performed by non-physician practitioner and as supervising physician I was immediately available for consultation/collaboration.   EKG Interpretation None       Martha K Linker, MD 07/14/14 1512 

## 2014-07-21 ENCOUNTER — Telehealth: Payer: Self-pay | Admitting: Physician Assistant

## 2014-07-21 ENCOUNTER — Telehealth: Payer: Self-pay

## 2014-07-21 DIAGNOSIS — G8929 Other chronic pain: Secondary | ICD-10-CM

## 2014-07-21 DIAGNOSIS — F430 Acute stress reaction: Secondary | ICD-10-CM

## 2014-07-21 DIAGNOSIS — M5432 Sciatica, left side: Secondary | ICD-10-CM

## 2014-07-21 MED ORDER — OXYCODONE-ACETAMINOPHEN 7.5-325 MG PO TABS: 1.0000 | ORAL_TABLET | Freq: Three times a day (TID) | ORAL | Status: DC | PRN

## 2014-07-21 NOTE — Telephone Encounter (Signed)
Pt stated that "he injured his finger lastweek and went to ER and nothing is broken but the said there may be torn ligament. He has a f/u with hand specialist. Hes taken extra pain medication for it and he's getting low. He would like a refill. He would like a call back letting him or his wife know if it was ok or not." LDM

## 2014-07-21 NOTE — Telephone Encounter (Signed)
Please inform patient that I will refill his medication for 70 tablets this month since he had an injury and required more medication.  He needs to follow-up with the Orthopedist the ER MD sent him to.  He can pick this prescription up tomorrow as I am not in office today to sign Rx.  Please print the Rx (or pick up off the printer as I am trying to print from home) and place on my desk so I can sign in the morning.

## 2014-07-22 MED ORDER — OXYCODONE-ACETAMINOPHEN 7.5-325 MG PO TABS: 1.0000 | ORAL_TABLET | Freq: Three times a day (TID) | ORAL | Status: DC | PRN

## 2014-07-22 NOTE — Telephone Encounter (Signed)
Called patient to advise his rx is ready for pick up.  The man that answered said Mr Tracy York is no longer at that address and he did not give me another address and phone number for the patient

## 2014-07-22 NOTE — Telephone Encounter (Signed)
Patient called wanting to know if rx was ready for pick up. I informed him that oxy rx was at front desk for pickup. Patient states that he had also requested a refill of alprazolam

## 2014-07-22 NOTE — Telephone Encounter (Signed)
I have printed Rx and placed at front desk.

## 2014-07-22 NOTE — Telephone Encounter (Signed)
Patient picked up oxy rx and informed him of what Selena BattenCody states about xanax

## 2014-07-22 NOTE — Telephone Encounter (Signed)
Patient is not due for Xanax refill until the 28th.  I will send in his refill to the pharmacy next week to pick up when it is due.

## 2014-07-27 MED ORDER — GABAPENTIN 100 MG PO CAPS: 100.0000 mg | ORAL_CAPSULE | Freq: Three times a day (TID) | ORAL | Status: DC

## 2014-07-27 NOTE — Addendum Note (Signed)
Addended by: Regis Bill on: 07/27/2014 04:44 PM   Modules accepted: Orders

## 2014-07-27 NOTE — Telephone Encounter (Signed)
Rx request for Gabapentin to pharmacy, 30-day supply only, [facility]

## 2014-07-28 ENCOUNTER — Telehealth: Payer: Self-pay | Admitting: Physician Assistant

## 2014-07-28 DIAGNOSIS — F430 Acute stress reaction: Secondary | ICD-10-CM

## 2014-07-28 NOTE — Telephone Encounter (Signed)
Requesting refill on xanax, can it be called into [facility] in [place]

## 2014-07-29 ENCOUNTER — Other Ambulatory Visit: Payer: Self-pay | Admitting: Physician Assistant

## 2014-07-29 DIAGNOSIS — F430 Acute stress reaction: Secondary | ICD-10-CM

## 2014-07-29 NOTE — Telephone Encounter (Signed)
COPY & PASTED HIGHLIGHTED INFORMATION REGARDING MEDICATION REQUESTED AND FORWARDED TO PATIENT, As he has already been informed once of when medication will be due for fill/ [08.28.15]SLS  Call Documentation      Tracy York, CMA at 07/27/2014 4:43 PM      Status: Signed         Rx request for Gabapentin to pharmacy, 30-day supply only, [facility]/[place]         Tracy York at 07/22/2014 6:24 PM      Status: Signed         Patient picked up oxy rx and informed him of what Cody states about xanax         Tracy C Martin, Tracy York at 07/22/2014 5:54 PM      Status: Signed         Patient is not due for Xanax refill until the 28th. I will send in his refill to the pharmacy next week to pick up when it is due.         Tracy York at 07/22/2014 5:41 PM      Status: Signed         Patient called wanting to know if rx was ready for pick up. I informed him that oxy rx was at front desk for pickup. Patient states that he had also requested a refill of alprazolam         Tracy York at 07/22/2014 9:02 AM      Status: Signed         Called patient to advise his rx is ready for pick up. The man that answered said Tracy York is no longer at that address and he did not give me another address and phone number for the patient      

## 2014-07-29 NOTE — Telephone Encounter (Signed)
COPY & PASTED HIGHLIGHTED INFORMATION REGARDING MEDICATION REQUESTED AND FORWARDED TO PATIENT, As he has already been informed once of when medication will be due for fill/ [08.28.15]SLS  Call Documentation      Regis Bill, CMA at 07/27/2014 4:43 PM      Status: Signed         Rx request for Gabapentin to pharmacy, 30-day supply only, [facility]/[place]         Dedra Skeens at 07/22/2014 6:24 PM      Status: Signed         Patient picked up oxy rx and informed him of what Selena Batten states about xanax         Waldon Merl, PA-C at 07/22/2014 5:54 PM      Status: Signed         Patient is not due for Xanax refill until the 28th. I will send in his refill to the pharmacy next week to pick up when it is due.         Dedra Skeens at 07/22/2014 5:41 PM      Status: Signed         Patient called wanting to know if rx was ready for pick up. I informed him that oxy rx was at front desk for pickup. Patient states that he had also requested a refill of alprazolam         Rachael Darby at 07/22/2014 9:02 AM      Status: Signed         Called patient to advise his rx is ready for pick up. The man that answered said Mr Packman is no longer at that address and he did not give me another address and phone number for the patient

## 2014-07-31 ENCOUNTER — Telehealth: Payer: Self-pay | Admitting: Physician Assistant

## 2014-07-31 MED ORDER — ALPRAZOLAM 2 MG PO TABS: 1.0000 mg | ORAL_TABLET | Freq: Three times a day (TID) | ORAL | Status: DC | PRN

## 2014-07-31 NOTE — Telephone Encounter (Signed)
DUPLICATE NOTE

## 2014-07-31 NOTE — Telephone Encounter (Signed)
Patient wife called in requesting a refill of alprazolam to be sent to [facility]

## 2014-07-31 NOTE — Telephone Encounter (Signed)
Refilled and sent to pharmacy.  Must stay with this pharmacy.

## 2014-07-31 NOTE — Telephone Encounter (Signed)
Please Advise on refills/SLS  

## 2014-07-31 NOTE — Telephone Encounter (Signed)
Patient Unavailable; spouse [Jessica] informed, understood [also that they cannot switch pharmacies again w/o voiding Controlled Substance Contract]/SLS

## 2014-07-31 NOTE — Telephone Encounter (Signed)
Pt calling checking on refill.  °

## 2014-08-03 ENCOUNTER — Telehealth: Payer: Self-pay | Admitting: *Deleted

## 2014-08-03 NOTE — Telephone Encounter (Signed)
[facility] Triage Call Report Triage Record Num: [id number] Operator: Durward Mallard [facility] Patient Name: Tracy York Call Date & Time: 07/31/2014 1:06:21PM Patient Phone: [phone] PCP: Chrissie Noa 'Selena BattenDaphine Deutscher Patient Gender: Male PCP Fax : [phone] Patient DOB: 03/16/1986 Practice Name: [facility] Reason for Call: Caller: Jessica/Spouse; PCP: Piedad Climes"; CB#: [phone]; Call regarding Refill on Xanax; wife states that office faxed Rx to [facility] today 07/31/14 but [facility] fax machine is down; requesting office to call in Xanax as ordered today to [facility] in [place] [phone]; Triage RN noted Rx in EPIC and states it was sent to [facility] in [place]; RN called [facility] [employer] and they said they never received it; instructed to disregard if Rx comes across fax; Xanax  tabs take 1/2 tab tid as needed use sparingly #45 2 refills called in to [facility] in [place] as ordered today by Malva Cogan; caller aware and voiced understanding; Triaged per Medication Questions Adult Guideline; Provide Health Information due to caller has medication question that was answered with available resources Protocol(s) Used: Medication Questions - Adult Recommended Outcome per Protocol: Provided Health Information Reason for Outcome: Caller has medication question(s) that was answered with available resources Care Advice:

## 2014-08-11 ENCOUNTER — Telehealth: Payer: Self-pay | Admitting: Physician Assistant

## 2014-08-11 NOTE — Telephone Encounter (Signed)
Caller name: Shanda Bumps  Relation to pt: spouse Call back number: [phone] Pharmacy:  Reason for call:   Patient wife called in stating that patient is abusing xanax. He is taking three at a time. She also states patient has been taking 3 percocet at a time.

## 2014-08-11 NOTE — Telephone Encounter (Signed)
I will try to speak with wife, Shanda Bumps, concerning this as patient has granted access of his PHI to her.  I have tried to contact her but could not reach her.  LMOM for her to callback so this can be addressed further.

## 2014-08-12 NOTE — Telephone Encounter (Signed)
Spoke with patient's wife who states patient is taking multiple Xanax and Percocet at a time.  Discussed with her that patient has been referred to a specialist concerning chronic back issues but has rescheduled appointments.  Because of that, he will not be getting refills of the pain medication anymore from our office.  In terms of the frequency of dosing, especially the Xanax, I cannot "take her word" for it.  Will discuss this with the patient further at his follow-up.  Advised her to encourage him to follow-up sooner and that she should come with him to the appointment.  Patient will be random drug screened at next visit and [employer] database will be reviewed for violation of his CSC.

## 2014-08-13 ENCOUNTER — Encounter (HOSPITAL_COMMUNITY): Payer: Self-pay | Admitting: Emergency Medicine

## 2014-08-13 ENCOUNTER — Emergency Department (HOSPITAL_COMMUNITY)
Admission: EM | Admit: 2014-08-13 | Discharge: 2014-08-14 | Disposition: A | Discharge status: Home / Self Care | Payer: Self-pay | Attending: Emergency Medicine | Admitting: Emergency Medicine

## 2014-08-13 DIAGNOSIS — Y9389 Activity, other specified: Secondary | ICD-10-CM | POA: Insufficient documentation

## 2014-08-13 DIAGNOSIS — S39011A Strain of muscle, fascia and tendon of abdomen, initial encounter: Secondary | ICD-10-CM

## 2014-08-13 DIAGNOSIS — Z8619 Personal history of other infectious and parasitic diseases: Secondary | ICD-10-CM | POA: Insufficient documentation

## 2014-08-13 DIAGNOSIS — I10 Essential (primary) hypertension: Secondary | ICD-10-CM | POA: Insufficient documentation

## 2014-08-13 DIAGNOSIS — Z8719 Personal history of other diseases of the digestive system: Secondary | ICD-10-CM | POA: Insufficient documentation

## 2014-08-13 DIAGNOSIS — IMO0002 Reserved for concepts with insufficient information to code with codable children: Secondary | ICD-10-CM | POA: Insufficient documentation

## 2014-08-13 DIAGNOSIS — R7402 Elevation of levels of lactic acid dehydrogenase (LDH): Secondary | ICD-10-CM | POA: Insufficient documentation

## 2014-08-13 DIAGNOSIS — S3981XA Other specified injuries of abdomen, initial encounter: Secondary | ICD-10-CM | POA: Insufficient documentation

## 2014-08-13 DIAGNOSIS — Y929 Unspecified place or not applicable: Secondary | ICD-10-CM | POA: Insufficient documentation

## 2014-08-13 DIAGNOSIS — Z79899 Other long term (current) drug therapy: Secondary | ICD-10-CM | POA: Insufficient documentation

## 2014-08-13 DIAGNOSIS — X500XXA Overexertion from strenuous movement or load, initial encounter: Secondary | ICD-10-CM | POA: Insufficient documentation

## 2014-08-13 DIAGNOSIS — F17200 Nicotine dependence, unspecified, uncomplicated: Secondary | ICD-10-CM | POA: Insufficient documentation

## 2014-08-13 DIAGNOSIS — R740 Nonspecific elevation of levels of transaminase and lactic acid dehydrogenase [LDH]: Secondary | ICD-10-CM

## 2014-08-13 DIAGNOSIS — R7401 Elevation of levels of liver transaminase levels: Secondary | ICD-10-CM | POA: Insufficient documentation

## 2014-08-13 LAB — COMPREHENSIVE METABOLIC PANEL
ALBUMIN: 4.2 g/dL (ref 3.5–5.2)
ALK PHOS: 90 U/L (ref 39–117)
ALT: 139 U/L — ABNORMAL HIGH (ref 0–53)
AST: 83 U/L — AB (ref 0–37)
Anion gap: 14 (ref 5–15)
BUN: 6 mg/dL (ref 6–23)
CALCIUM: 9.7 mg/dL (ref 8.4–10.5)
CO2: 26 mEq/L (ref 19–32)
Chloride: 103 mEq/L (ref 96–112)
Creatinine, Ser: 0.85 mg/dL (ref 0.50–1.35)
GFR calc Af Amer: >90 mL/min (ref >90)
GFR calc non Af Amer: >90 mL/min (ref >90)
GLUCOSE: 94 mg/dL (ref 70–99)
POTASSIUM: 3.7 meq/L (ref 3.7–5.3)
SODIUM: 143 meq/L (ref 137–147)
Total Bilirubin: 0.6 mg/dL (ref 0.3–1.2)
Total Protein: 7.9 g/dL (ref 6.0–8.3)

## 2014-08-13 LAB — CBC
HCT: 44.1 % (ref 39.0–52.0)
HEMOGLOBIN: 15.2 g/dL (ref 13.0–17.0)
MCH: 31.6 pg (ref 26.0–34.0)
MCHC: 34.5 g/dL (ref 30.0–36.0)
MCV: 91.7 fL (ref 78.0–100.0)
PLATELETS: 280 10*3/uL (ref 150–400)
RBC: 4.81 MIL/uL (ref 4.22–5.81)
RDW: 14.5 % (ref 11.5–15.5)
WBC: 9.4 10*3/uL (ref 4.0–10.5)

## 2014-08-13 LAB — LIPASE, BLOOD: Lipase: 58 U/L (ref 11–59)

## 2014-08-13 MED ORDER — NAPROXEN 500 MG PO TABS: 500.0000 mg | ORAL_TABLET | Freq: Two times a day (BID) | ORAL | Status: DC

## 2014-08-13 MED ORDER — OXYCODONE-ACETAMINOPHEN 5-325 MG PO TABS: 1.0000 | ORAL_TABLET | ORAL | Status: DC | PRN

## 2014-08-13 NOTE — Discharge Instructions (Signed)
Some blood tests of your liver were slightly elevated-AST and ALT. These need to be rechecked in the next several weeks. Avoid all alcohol.  You need to obtain all of your narcotic prescriptions for your PCP.  Muscle Strain A muscle strain is an injury that occurs when a muscle is stretched beyond its normal length. Usually a small number of muscle fibers are torn when this happens. Muscle strain is rated in degrees. First-degree strains have the least amount of muscle fiber tearing and pain. Second-degree and third-degree strains have increasingly more tearing and pain.  Usually, recovery from muscle strain takes 1-2 weeks. Complete healing takes 5-6 weeks.  CAUSES  Muscle strain happens when a sudden, violent force placed on a muscle stretches it too far. This may occur with lifting, sports, or a fall.  RISK FACTORS Muscle strain is especially common in athletes.  SIGNS AND SYMPTOMS At the site of the muscle strain, there may be:  Pain.  Bruising.  Swelling.  Difficulty using the muscle due to pain or lack of normal function. DIAGNOSIS  Your health care provider will perform a physical exam and ask about your medical history. TREATMENT  Often, the best treatment for a muscle strain is resting, icing, and applying cold compresses to the injured area.  HOME CARE INSTRUCTIONS   Use the PRICE method of treatment to promote muscle healing during the first 2-3 days after your injury. The PRICE method involves:  Protecting the muscle from being injured again.  Restricting your activity and resting the injured body part.  Icing your injury. To do this, put ice in a plastic bag. Place a towel between your skin and the bag. Then, apply the ice and leave it on from 15-20 minutes each hour. After the third day, switch to moist heat packs.  Apply compression to the injured area with a splint or elastic bandage. Be careful not to wrap it too tightly. This may interfere with blood circulation  or increase swelling.  Elevate the injured body part above the level of your heart as often as you can.  Only take over-the-counter or prescription medicines for pain, discomfort, or fever as directed by your health care provider.  Warming up prior to exercise helps to prevent future muscle strains. SEEK MEDICAL CARE IF:   You have increasing pain or swelling in the injured area.  You have numbness, tingling, or a significant loss of strength in the injured area. MAKE SURE YOU:   Understand these instructions.  Will watch your condition.  Will get help right away if you are not doing well or get worse. Document Released: 11/20/2005 Document Revised: 09/10/2013 Document Reviewed: 06/19/2013 [facility] Patient Information 2015 [place], [place]. This information is not intended to replace advice given to you by your health care provider. Make sure you discuss any questions you have with your health care provider.  Naproxen and naproxen sodium oral immediate-release tablets What is this medicine? NAPROXEN (na PROX en) is a non-steroidal anti-inflammatory drug (NSAID). It is used to reduce swelling and to treat pain. This medicine may be used for dental pain, headache, or painful monthly periods. It is also used for painful joint and muscular problems such as arthritis, tendinitis, bursitis, and gout. This medicine may be used for other purposes; ask your health care provider or pharmacist if you have questions. COMMON BRAND NAME(S): Aflaxen, Aleve, Aleve Arthritis, All Day Relief, Anaprox, Anaprox DS, Naprosyn What should I tell my health care provider before I take this medicine?  They need to know if you have any of these conditions: -asthma -cigarette smoker -drink more than 3 alcohol containing drinks a day -heart disease or circulation problems such as heart failure or leg edema (fluid retention) -high blood pressure -kidney disease -liver disease -stomach bleeding or ulcers -an  unusual or allergic reaction to naproxen, aspirin, other NSAIDs, other medicines, foods, dyes, or preservatives -pregnant or trying to get pregnant -breast-feeding How should I use this medicine? Take this medicine by mouth with a glass of water. Follow the directions on the prescription label. Take it with food if your stomach gets upset. Try to not lie down for at least 10 minutes after you take it. Take your medicine at regular intervals. Do not take your medicine more often than directed. Long-term, continuous use may increase the risk of heart attack or stroke. A special MedGuide will be given to you by the pharmacist with each prescription and refill. Be sure to read this information carefully each time. Talk to your pediatrician regarding the use of this medicine in children. Special care may be needed. Overdosage: If you think you have taken too much of this medicine contact a poison control center or emergency room at once. NOTE: This medicine is only for you. Do not share this medicine with others. What if I miss a dose? If you miss a dose, take it as soon as you can. If it is almost time for your next dose, take only that dose. Do not take double or extra doses. What may interact with this medicine? -alcohol -aspirin -cidofovir -diuretics -lithium -methotrexate -other drugs for inflammation like ketorolac or prednisone -pemetrexed -probenecid -warfarin This list may not describe all possible interactions. Give your health care provider a list of all the medicines, herbs, non-prescription drugs, or dietary supplements you use. Also tell them if you smoke, drink alcohol, or use illegal drugs. Some items may interact with your medicine. What should I watch for while using this medicine? Tell your doctor or health care professional if your pain does not get better. Talk to your doctor before taking another medicine for pain. Do not treat yourself. This medicine does not prevent heart  attack or stroke. In fact, this medicine may increase the chance of a heart attack or stroke. The chance may increase with longer use of this medicine and in people who have heart disease. If you take aspirin to prevent heart attack or stroke, talk with your doctor or health care professional. Do not take other medicines that contain aspirin, ibuprofen, or naproxen with this medicine. Side effects such as stomach upset, nausea, or ulcers may be more likely to occur. Many medicines available without a prescription should not be taken with this medicine. This medicine can cause ulcers and bleeding in the stomach and intestines at any time during treatment. Do not smoke cigarettes or drink alcohol. These increase irritation to your stomach and can make it more susceptible to damage from this medicine. Ulcers and bleeding can happen without warning symptoms and can cause death. You may get drowsy or dizzy. Do not drive, use machinery, or do anything that needs mental alertness until you know how this medicine affects you. Do not stand or sit up quickly, especially if you are an older patient. This reduces the risk of dizzy or fainting spells. This medicine can cause you to bleed more easily. Try to avoid damage to your teeth and gums when you brush or floss your teeth. What side effects may I  notice from receiving this medicine? Side effects that you should report to your doctor or health care professional as soon as possible: -black or bloody stools, blood in the urine or vomit -blurred vision -chest pain -difficulty breathing or wheezing -nausea or vomiting -severe stomach pain -skin rash, skin redness, blistering or peeling skin, hives, or itching -slurred speech or weakness on one side of the body -swelling of eyelids, throat, lips -unexplained weight gain or swelling -unusually weak or tired -yellowing of eyes or skin Side effects that usually do not require medical attention (report to your  doctor or health care professional if they continue or are bothersome): -constipation -headache -heartburn This list may not describe all possible side effects. Call your doctor for medical advice about side effects. You may report side effects to FDA at 1-800-FDA-1088. Where should I keep my medicine? Keep out of the reach of children. Store at room temperature between 15 and 30 degrees C (59 and 86 degrees F). Keep container tightly closed. Throw away any unused medicine after the expiration date. NOTE: This sheet is a summary. It may not cover all possible information. If you have questions about this medicine, talk to your doctor, pharmacist, or health care provider.  2015, [employer] Standard. (2009-11-22 20:10:16)

## 2014-08-13 NOTE — ED Notes (Signed)
Pain RLQ , onset today after picking up a log

## 2014-08-13 NOTE — ED Provider Notes (Signed)
CSN: [id number]     Arrival date & time 08/13/14  2017 History   First MD Initiated Contact with Patient 08/13/14 2326     Chief Complaint  Patient presents with  . Abdominal Pain     (Consider location/radiation/quality/duration/timing/severity/associated sxs/prior Treatment) Patient is a 28 y.o. male presenting with abdominal pain. The history is provided by the patient.  Abdominal Pain He suffered an injury to his right lower abdomen today. He was trying to help remove a stump and his body got twisted and he felt something pull in the right lower abdomen. He continues to have pain in that area. Pain is severe and he rates it at 8/10. It is worse with movement better with rest sitting still. There is no nausea or vomiting. He denies other injury.  Past Medical History  Diagnosis Date  . Chicken pox   . Multiple gastric ulcers   . Hypertension    Past Surgical History  Procedure Laterality Date  . Appendectomy  2008  . Wisdom tooth extraction  2009   Family History  Problem Relation Age of Onset  . Arthritis Father     Living  . Other Father     Lung Mass  . Heart disease Father   . Heart attack Father   . Arthritis Paternal Grandfather   . Arthritis Paternal Grandmother   . Heart disease Paternal Grandmother   . Stroke Paternal Grandmother   . Heart disease Paternal Grandfather   . Alzheimer's disease Paternal Grandfather   . Hypertension Paternal Grandfather   . Hypertension Paternal Grandmother   . Diabetes Paternal Grandfather   . Heart failure Paternal Aunt   . Heart failure Paternal Uncle   . Healthy Brother     x2  . Healthy Sister     x3  . Healthy Daughter     x1  . Allergies Daughter    History  Substance Use Topics  . Smoking status: Current Every Day Smoker -- 0.50 packs/day for 15 years  . Smokeless tobacco: Never Used  . Alcohol Use: 0.0 oz/week    1-2 Cans of beer per week     Comment: occasionally     Review of Systems  Gastrointestinal:  Positive for abdominal pain.  All other systems reviewed and are negative.     Allergies  Review of patient's allergies indicates no known allergies.  Home Medications   Prior to Admission medications   Medication Sig Start Date End Date Taking? Authorizing Provider  alprazolam Prudy Feeler) 2 MG tablet Take 0.5 tablets (1 mg total) by mouth 3 (three) times daily as needed for anxiety. Use sparingly. 07/31/14  Yes Waldon Merl, PA-C  gabapentin (NEURONTIN) 100 MG capsule Take 1 capsule (100 mg total) by mouth 3 (three) times daily. 07/27/14  Yes Waldon Merl, PA-C  lisinopril (PRINIVIL,ZESTRIL) 10 MG tablet Take 1 tablet (10 mg total) by mouth daily. 06/03/14  Yes Waldon Merl, PA-C  Multiple Vitamin (MULTIVITAMIN) tablet Take 1 tablet by mouth daily.    Yes Historical Provider, MD  oxyCODONE-acetaminophen (PERCOCET) 7.5-325 MG per tablet Take 1 tablet by mouth every 8 (eight) hours as needed for pain. 07/22/14  Yes Waldon Merl, PA-C   BP 121/80  Pulse 64  Temp(Src) 98.1 F (36.7 C) (Oral)  Resp 18  Ht  (1.778 m)  Wt 176 lb (79.833 kg)  BMI 25.25 kg/m2  SpO2 100% Physical Exam  Nursing note and vitals reviewed.  28 year old male, resting  comfortably and in no acute distress. Vital signs are normal. Oxygen saturation is 100%, which is normal. Head is normocephalic and atraumatic. PERRLA, EOMI. Oropharynx is clear. Neck is nontender and supple without adenopathy or JVD. Back is nontender and there is no CVA tenderness. Lungs are clear without rales, wheezes, or rhonchi. Chest is nontender. Heart has regular rate and rhythm without murmur. Abdomen is soft, flat, with mild tenderness in the right lower abdomen. There is no rebound or guarding. There is no defect in the muscular wall palpable. There are no masses or hepatosplenomegaly and peristalsis is normoactive. Genitalia: Circumcised penis. Testes descended without masses. No inguinal hernia palpable. Extremities  have no cyanosis or edema, full range of motion is present. Skin is warm and dry without rash. Neurologic: Mental status is normal, cranial nerves are intact, there are no motor or sensory deficits.  ED Course  Procedures (including critical care time) Labs Review Results for orders placed during the hospital encounter of 08/13/14  CBC      Result Value Ref Range   WBC 9.4  4.0 - 10.5 K/uL   RBC 4.81  4.22 - 5.81 MIL/uL   Hemoglobin 15.2  13.0 - 17.0 g/dL   HCT 16.1  09.6 - 04.5 %   MCV 91.7  78.0 - 100.0 fL   MCH 31.6  26.0 - 34.0 pg   MCHC 34.5  30.0 - 36.0 g/dL   RDW 40.9  81.1 - 91.4 %   Platelets 280  150 - 400 K/uL  COMPREHENSIVE METABOLIC PANEL      Result Value Ref Range   Sodium 143  137 - 147 mEq/L   Potassium 3.7  3.7 - 5.3 mEq/L   Chloride 103  96 - 112 mEq/L   CO2 26  19 - 32 mEq/L   Glucose, Bld 94  70 - 99 mg/dL   BUN 6  6 - 23 mg/dL   Creatinine, Ser 7.82  0.50 - 1.35 mg/dL   Calcium 9.7  8.4 - 95.6 mg/dL   Total Protein 7.9  6.0 - 8.3 g/dL   Albumin 4.2  3.5 - 5.2 g/dL   AST 83 (*) 0 - 37 U/L   ALT 139 (*) 0 - 53 U/L   Alkaline Phosphatase 90  39 - 117 U/L   Total Bilirubin 0.6  0.3 - 1.2 mg/dL   GFR calc non Af Amer >90  >90 mL/min   GFR calc Af Amer >90  >90 mL/min   Anion gap 14  5 - 15  LIPASE, BLOOD      Result Value Ref Range   Lipase 58  11 - 59 U/L   MDM   Final diagnoses:  Abdominal wall strain, initial encounter  Elevated transaminase level    Strain of the abdominal wall muscles. Laboratory workup does show a mild elevation of transaminases. This will need to be followed up the patient is advised to avoid all alcohol. Old records are reviewed and it is noted that he is on a pain management contract. Last prescription for pain pills was filled on August 19 for her 70 tablets of oxycodone and acetaminophen 7.5-325. Patient states that he is out of that medication he also states that he is no longer on a pain management contract stating that  it expired last month. I've advised him that I would not risk violating his pain management contract but he is given a to go pack of oxycodone-acetaminophen to last him until he can contact  his PCP in the morning. He is given work release for one day and is given a prescription for naproxen.    Dione Booze, MD 08/13/14 [phone]

## 2014-08-17 MED FILL — Oxycodone w/ Acetaminophen Tab 5-325 MG: ORAL | Qty: 6 | Status: AC

## 2014-08-30 ENCOUNTER — Other Ambulatory Visit: Payer: Self-pay | Admitting: Physician Assistant

## 2014-08-30 DIAGNOSIS — F430 Acute stress reaction: Secondary | ICD-10-CM

## 2014-08-30 MED ORDER — ALPRAZOLAM 2 MG PO TABS: 1.0000 mg | ORAL_TABLET | Freq: Three times a day (TID) | ORAL | Status: AC | PRN

## 2014-09-15 ENCOUNTER — Emergency Department (HOSPITAL_COMMUNITY): Payer: Self-pay

## 2014-09-15 ENCOUNTER — Emergency Department (HOSPITAL_COMMUNITY)
Admission: EM | Admit: 2014-09-15 | Discharge: 2014-09-15 | Disposition: A | Discharge status: Home / Self Care | Payer: Self-pay | Attending: Emergency Medicine | Admitting: Emergency Medicine

## 2014-09-15 ENCOUNTER — Encounter (HOSPITAL_COMMUNITY): Payer: Self-pay | Admitting: Emergency Medicine

## 2014-09-15 DIAGNOSIS — S62339A Displaced fracture of neck of unspecified metacarpal bone, initial encounter for closed fracture: Secondary | ICD-10-CM

## 2014-09-15 DIAGNOSIS — S62326A Displaced fracture of shaft of fifth metacarpal bone, right hand, initial encounter for closed fracture: Secondary | ICD-10-CM | POA: Insufficient documentation

## 2014-09-15 DIAGNOSIS — Y929 Unspecified place or not applicable: Secondary | ICD-10-CM | POA: Insufficient documentation

## 2014-09-15 DIAGNOSIS — Z720 Tobacco use: Secondary | ICD-10-CM | POA: Insufficient documentation

## 2014-09-15 DIAGNOSIS — Z8619 Personal history of other infectious and parasitic diseases: Secondary | ICD-10-CM | POA: Insufficient documentation

## 2014-09-15 DIAGNOSIS — Z8719 Personal history of other diseases of the digestive system: Secondary | ICD-10-CM | POA: Insufficient documentation

## 2014-09-15 DIAGNOSIS — I10 Essential (primary) hypertension: Secondary | ICD-10-CM | POA: Insufficient documentation

## 2014-09-15 DIAGNOSIS — Y9389 Activity, other specified: Secondary | ICD-10-CM | POA: Insufficient documentation

## 2014-09-15 DIAGNOSIS — W2201XA Walked into wall, initial encounter: Secondary | ICD-10-CM | POA: Insufficient documentation

## 2014-09-15 MED ORDER — HYDROCODONE-ACETAMINOPHEN 5-325 MG PO TABS
1.0000 | ORAL_TABLET | Freq: Once | ORAL | Status: AC
  Administered 2014-09-15: 1 via ORAL
  Filled 2014-09-15: qty 1

## 2014-09-15 MED ORDER — HYDROMORPHONE HCL 1 MG/ML IJ SOLN
1.0000 mg | Freq: Once | INTRAMUSCULAR | Status: AC
  Administered 2014-09-15: 1 mg via INTRAMUSCULAR
  Filled 2014-09-15: qty 1

## 2014-09-15 MED ORDER — OXYCODONE-ACETAMINOPHEN 5-325 MG PO TABS: 1.0000 | ORAL_TABLET | ORAL | Status: DC | PRN

## 2014-09-15 MED ORDER — ONDANSETRON 8 MG PO TBDP
8.0000 mg | ORAL_TABLET | Freq: Once | ORAL | Status: AC
  Administered 2014-09-15: 8 mg via ORAL
  Filled 2014-09-15: qty 1

## 2014-09-15 NOTE — ED Provider Notes (Signed)
CSN: [id number]     Arrival date & time 09/15/14  1141 History   First MD Initiated Contact with Patient 09/15/14 1153     Chief Complaint  Patient presents with  . Hand Pain     (Consider location/radiation/quality/duration/timing/severity/associated sxs/prior Treatment) The history is provided by the patient.   Tracy York is a 28 y.o. right-handed male presenting with pain and swelling of his right lateral hand and fifth finger since yesterday when he became angry and punched a wall.  The pain is constant, aching and radiating into his wrist, but denies increased pain with wrist movement or palpation.  He does have a tingling sensation in his distal fifth finger and sensation to fine touch is present but reduced.  He has taken no medications nor is he applied treatment such as ice to his injury since the occurrence.  Past medical history is noncontributory.  He does not have general orthopedist.     Past Medical History  Diagnosis Date  . Chicken pox   . Multiple gastric ulcers   . Hypertension    Past Surgical History  Procedure Laterality Date  . Appendectomy  2008  . Wisdom tooth extraction  2009   Family History  Problem Relation Age of Onset  . Arthritis Father     Living  . Other Father     Lung Mass  . Heart disease Father   . Heart attack Father   . Arthritis Paternal Grandfather   . Arthritis Paternal Grandmother   . Heart disease Paternal Grandmother   . Stroke Paternal Grandmother   . Heart disease Paternal Grandfather   . Alzheimer's disease Paternal Grandfather   . Hypertension Paternal Grandfather   . Hypertension Paternal Grandmother   . Diabetes Paternal Grandfather   . Heart failure Paternal Aunt   . Heart failure Paternal Uncle   . Healthy Brother     x2  . Healthy Sister     x3  . Healthy Daughter     x1  . Allergies Daughter    History  Substance Use Topics  . Smoking status: Current Every Day Smoker -- 0.50 packs/day for 15 years  .  Smokeless tobacco: Never Used  . Alcohol Use: 0.0 oz/week    1-2 Cans of beer per week     Comment: occasionally     Review of Systems  Constitutional: Negative for fever.  Musculoskeletal: Positive for arthralgias and joint swelling. Negative for myalgias.  Neurological: Positive for numbness. Negative for weakness.      Allergies  Review of patient's allergies indicates no known allergies.  Home Medications   Prior to Admission medications   Medication Sig Start Date End Date Taking? Authorizing Provider  alprazolam Prudy Feeler(XANAX) 2 MG tablet Take 0.5 tablets (1 mg total) by mouth 3 (three) times daily as needed for anxiety. Use sparingly. 08/30/14  Yes Waldon MerlWilliam C Martin, PA-C  oxyCODONE-acetaminophen (PERCOCET/ROXICET) 5-325 MG per tablet Take 1 tablet by mouth every 4 (four) hours as needed. 09/15/14   Burgess AmorJulie Darnella Zeiter, PA-C   BP 122/77  Pulse 114  Temp(Src) 98.2 F (36.8 C)  Resp 18  Ht 5\' 10"  (1.778 m)  Wt 170 lb (77.111 kg)  BMI 24.39 kg/m2  SpO2 100% Physical Exam  Constitutional: He appears well-developed and well-nourished.  HENT:  Head: Atraumatic.  Neck: Normal range of motion.  Cardiovascular:  Pulses equal bilaterally  Musculoskeletal: He exhibits edema and tenderness.       Hands: Palpation along the right distal  fifth metacarpal along with edema and bruising.  He has less than 2 second cap refill in all of his fingertips on his hand.  He has sensation fully and all fingers with reduced but present sensation at the tip of his fifth finger.  Skin is intact.  Proximal hand and wrist and forearm nontender.  Neurological: He is alert. He has normal strength. He displays normal reflexes. No sensory deficit.  Skin: Skin is warm and dry.  Psychiatric: He has a normal mood and affect.    ED Course  Procedures (including critical care time) Labs Review Labs Reviewed - No data to display  Imaging Review Dg Hand Complete Right  09/15/2014   CLINICAL DATA:  Pain and  swelling around the fifth metacarpal phalangeal joint after punching a wall last night.  EXAM: RIGHT HAND - COMPLETE 3+ VIEW  COMPARISON:  09/15/2009  FINDINGS: There is a slightly angulated slightly displaced fracture of the distal shaft of the fifth metacarpal. The other bones of the hand are normal.  IMPRESSION: Fifth metacarpal shaft fracture.   Electronically Signed   By: Geanie CooleyJim  Maxwell M.D.   On: 09/15/2014 13:08     EKG Interpretation None      MDM   Final diagnoses:  Boxer's fracture, closed, initial encounter    Patients labs and/or radiological studies were viewed and considered during the medical decision making and disposition process.  Patient was placed in an ulnar gutter splint, sling provided.  He was prescribed oxycodone.  Advise followup with Dr. Romeo AppleHarrison this week for recheck and further management of this injury.  Patient understands plan and will call for followup care.  Splint was examined post application, pain improved,  Patient can wiggle digits, less than 2 sec cap refill.      Burgess AmorJulie Draya Felker, PA-C 09/15/14 1819

## 2014-09-15 NOTE — ED Notes (Signed)
Pt c/o right hand pain and swelling after punching a wall last night. C/m/s intact. Swelling noted.

## 2014-09-15 NOTE — Discharge Instructions (Signed)
Boxer's Fracture °You have a break (fracture) of the fifth metacarpal bone. This is commonly called a boxer's fracture. This is the bone in the hand where the little finger attaches. The fracture is in the end of that bone, closest to the little finger. It is usually caused when you hit an object with a clenched fist. Often, the knuckle is pushed down by the impact. Sometimes, the fracture rotates out of position. A boxer's fracture will usually heal within 6 weeks, if it is treated properly and protected from re-injury. Surgery is sometimes needed. °A cast, splint, or bulky hand dressing may be used to protect and immobilize a boxer's fracture. Do not remove this device or dressing until your caregiver approves. Keep your hand elevated, and apply ice packs for 15-20 minutes every 2 hours, for the first 2 days. Elevation and ice help reduce swelling and relieve pain. See your caregiver, or an orthopedic specialist, for follow-up care within the next 10 days. This is to make sure your fracture is healing properly. °Document Released: 11/20/2005 Document Revised: 02/12/2012 Document Reviewed: 05/10/2007 °ExitCare® Patient Information ©2015 [employer]. This information is not intended to replace advice given to you by your health care provider. Make sure you discuss any questions you have with your health care provider. ° °Cast or Splint Care °Casts and splints support injured limbs and keep bones from moving while they heal. It is important to care for your cast or splint at home.   °HOME CARE INSTRUCTIONS °· Keep the cast or splint uncovered during the drying period. It can take 24 to 48 hours to dry if it is made of plaster. A fiberglass cast will dry in less than 1 hour. °· Do not rest the cast on anything harder than a pillow for the first 24 hours. °· Do not put weight on your injured limb or apply pressure to the cast until your health care provider gives you permission. °· Keep the cast or splint dry. Wet  casts or splints can lose their shape and may not support the limb as well. A wet cast that has lost its shape can also create harmful pressure on your skin when it dries. Also, wet skin can become infected. °¨ Cover the cast or splint with a plastic bag when bathing or when out in the rain or snow. If the cast is on the trunk of the body, take sponge baths until the cast is removed. °¨ If your cast does become wet, dry it with a towel or a blow dryer on the cool setting only. °· Keep your cast or splint clean. Soiled casts may be wiped with a moistened cloth. °· Do not place any hard or soft foreign objects under your cast or splint, such as cotton, toilet paper, lotion, or powder. °· Do not try to scratch the skin under the cast with any object. The object could get stuck inside the cast. Also, scratching could lead to an infection. If itching is a problem, use a blow dryer on a cool setting to relieve discomfort. °· Do not trim or cut your cast or remove padding from inside of it. °· Exercise all joints next to the injury that are not immobilized by the cast or splint. For example, if you have a long leg cast, exercise the hip joint and toes. If you have an arm cast or splint, exercise the shoulder, elbow, thumb, and fingers. °· Elevate your injured arm or leg on 1 or 2 pillows for the   first 1 to 3 days to decrease swelling and pain. It is best if you can comfortably elevate your cast so it is higher than your heart. °SEEK MEDICAL CARE IF:  °· Your cast or splint cracks. °· Your cast or splint is too tight or too loose. °· You have unbearable itching inside the cast. °· Your cast becomes wet or develops a soft spot or area. °· You have a bad smell coming from inside your cast. °· You get an object stuck under your cast. °· Your skin around the cast becomes red or raw. °· You have new pain or worsening pain after the cast has been applied. °SEEK IMMEDIATE MEDICAL CARE IF:  °· You have fluid leaking through the  cast. °· You are unable to move your fingers or toes. °· You have discolored (blue or white), cool, painful, or very swollen fingers or toes beyond the cast. °· You have tingling or numbness around the injured area. °· You have severe pain or pressure under the cast. °· You have any difficulty with your breathing or have shortness of breath. °· You have chest pain. °Document Released: 11/17/2000 Document Revised: 09/10/2013 Document Reviewed: 05/29/2013 °ExitCare® Patient Information ©2015 [employer]. This information is not intended to replace advice given to you by your health care provider. Make sure you discuss any questions you have with your health care provider. ° °

## 2014-09-16 ENCOUNTER — Emergency Department (HOSPITAL_COMMUNITY)
Admission: EM | Admit: 2014-09-16 | Discharge: 2014-09-16 | Disposition: A | Discharge status: Home / Self Care | Payer: Self-pay | Attending: Emergency Medicine | Admitting: Emergency Medicine

## 2014-09-16 ENCOUNTER — Encounter (HOSPITAL_COMMUNITY): Payer: Self-pay | Admitting: Emergency Medicine

## 2014-09-16 DIAGNOSIS — Z8619 Personal history of other infectious and parasitic diseases: Secondary | ICD-10-CM | POA: Insufficient documentation

## 2014-09-16 DIAGNOSIS — S62338D Displaced fracture of neck of other metacarpal bone, subsequent encounter for fracture with routine healing: Secondary | ICD-10-CM | POA: Insufficient documentation

## 2014-09-16 DIAGNOSIS — Z8719 Personal history of other diseases of the digestive system: Secondary | ICD-10-CM | POA: Insufficient documentation

## 2014-09-16 DIAGNOSIS — Z720 Tobacco use: Secondary | ICD-10-CM | POA: Insufficient documentation

## 2014-09-16 DIAGNOSIS — W228XXD Striking against or struck by other objects, subsequent encounter: Secondary | ICD-10-CM | POA: Insufficient documentation

## 2014-09-16 DIAGNOSIS — S62309D Unspecified fracture of unspecified metacarpal bone, subsequent encounter for fracture with routine healing: Secondary | ICD-10-CM

## 2014-09-16 DIAGNOSIS — I10 Essential (primary) hypertension: Secondary | ICD-10-CM | POA: Insufficient documentation

## 2014-09-16 MED ORDER — OXYCODONE-ACETAMINOPHEN 5-325 MG PO TABS
2.0000 | ORAL_TABLET | Freq: Once | ORAL | Status: AC
  Administered 2014-09-16: 2 via ORAL
  Filled 2014-09-16: qty 2

## 2014-09-16 MED ORDER — NAPROXEN 500 MG PO TABS: 500.0000 mg | ORAL_TABLET | Freq: Two times a day (BID) | ORAL | Status: DC

## 2014-09-16 NOTE — ED Provider Notes (Signed)
Medical screening examination/treatment/procedure(s) were performed by non-physician practitioner and as supervising physician I was immediately available for consultation/collaboration.   EKG Interpretation None        Kristen N Ward, DO 09/16/14 0705 

## 2014-09-16 NOTE — ED Notes (Signed)
nad noted prior to dc. Dc instructions reviewed. Voiced understanding. 1 Rx given. Ambulated out without difficulty.
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
## 2014-09-16 NOTE — Discharge Instructions (Signed)
Cast or Splint Care °Casts and splints support injured limbs and keep bones from moving while they heal.  °HOME CARE °· Keep the cast or splint uncovered during the drying period. °¨ A plaster cast can take 24 to 48 hours to dry. °¨ A fiberglass cast will dry in less than 1 hour. °· Do not rest the cast on anything harder than a pillow for 24 hours. °· Do not put weight on your injured limb. Do not put pressure on the cast. Wait for your doctor's approval. °· Keep the cast or splint dry. °¨ Cover the cast or splint with a plastic bag during baths or wet weather. °¨ If you have a cast over your chest and belly (trunk), take sponge baths until the cast is taken off. °¨ If your cast gets wet, dry it with a towel or blow dryer. Use the cool setting on the blow dryer. °· Keep your cast or splint clean. Wash a dirty cast with a damp cloth. °· Do not put any objects under your cast or splint. °· Do not scratch the skin under the cast with an object. If itching is a problem, use a blow dryer on a cool setting over the itchy area. °· Do not trim or cut your cast. °· Do not take out the padding from inside your cast. °· Exercise your joints near the cast as told by your doctor. °· Raise (elevate) your injured limb on 1 or 2 pillows for the first 1 to 3 days. °GET HELP IF: °· Your cast or splint cracks. °· Your cast or splint is too tight or too loose. °· You itch badly under the cast. °· Your cast gets wet or has a soft spot. °· You have a bad smell coming from the cast. °· You get an object stuck under the cast. °· Your skin around the cast becomes red or sore. °· You have new or more pain after the cast is put on. °GET HELP RIGHT AWAY IF: °· You have fluid leaking through the cast. °· You cannot move your fingers or toes. °· Your fingers or toes turn blue or white or are cool, painful, or puffy (swollen). °· You have tingling or lose feeling (numbness) around the injured area. °· You have bad pain or pressure under the  cast. °· You have trouble breathing or have shortness of breath. °· You have chest pain. °Document Released: 03/22/2011 Document Revised: 07/23/2013 Document Reviewed: 05/29/2013 °ExitCare® Patient Information ©2015 [employer]. This information is not intended to replace advice given to you by your health care provider. Make sure you discuss any questions you have with your health care provider. ° °

## 2014-09-17 ENCOUNTER — Telehealth: Payer: Self-pay | Admitting: Physician Assistant

## 2014-09-17 NOTE — Telephone Encounter (Signed)
Called and spoke with pharmacist at [facility] [place] and had remaining refills for Xanax 2mg  cancelled. JG//CMA
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
## 2014-09-17 NOTE — Telephone Encounter (Signed)
Thank you :)
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
## 2014-09-20 NOTE — ED Provider Notes (Signed)
Medical screening examination/treatment/procedure(s) were performed by non-physician practitioner and as supervising physician I was immediately available for consultation/collaboration.   EKG Interpretation None        Tannya Gonet L Able Malloy, MD 09/20/14 0024 

## 2014-09-21 ENCOUNTER — Emergency Department (HOSPITAL_COMMUNITY): Admission: EM | Admit: 2014-09-21 | Discharge: 2014-09-21 | Discharge status: Against Medical Advice | Payer: Self-pay

## 2014-09-22 ENCOUNTER — Telehealth: Payer: Self-pay

## 2014-09-22 ENCOUNTER — Telehealth: Payer: Self-pay | Admitting: Physician Assistant

## 2014-09-22 ENCOUNTER — Encounter (HOSPITAL_COMMUNITY): Payer: Self-pay | Admitting: Emergency Medicine

## 2014-09-22 ENCOUNTER — Emergency Department (HOSPITAL_COMMUNITY): Payer: Self-pay

## 2014-09-22 ENCOUNTER — Encounter: Payer: Self-pay | Admitting: Physician Assistant

## 2014-09-22 ENCOUNTER — Emergency Department (HOSPITAL_COMMUNITY)
Admission: EM | Admit: 2014-09-22 | Discharge: 2014-09-22 | Disposition: A | Discharge status: Home / Self Care | Payer: Self-pay | Attending: Emergency Medicine | Admitting: Emergency Medicine

## 2014-09-22 ENCOUNTER — Telehealth: Payer: Self-pay | Admitting: *Deleted

## 2014-09-22 DIAGNOSIS — S62316D Displaced fracture of base of fifth metacarpal bone, right hand, subsequent encounter for fracture with routine healing: Secondary | ICD-10-CM | POA: Insufficient documentation

## 2014-09-22 DIAGNOSIS — Z8719 Personal history of other diseases of the digestive system: Secondary | ICD-10-CM | POA: Insufficient documentation

## 2014-09-22 DIAGNOSIS — I10 Essential (primary) hypertension: Secondary | ICD-10-CM | POA: Insufficient documentation

## 2014-09-22 DIAGNOSIS — Z791 Long term (current) use of non-steroidal anti-inflammatories (NSAID): Secondary | ICD-10-CM | POA: Insufficient documentation

## 2014-09-22 DIAGNOSIS — S60051A Contusion of right little finger without damage to nail, initial encounter: Secondary | ICD-10-CM | POA: Insufficient documentation

## 2014-09-22 DIAGNOSIS — Z720 Tobacco use: Secondary | ICD-10-CM | POA: Insufficient documentation

## 2014-09-22 DIAGNOSIS — Z8619 Personal history of other infectious and parasitic diseases: Secondary | ICD-10-CM | POA: Insufficient documentation

## 2014-09-22 DIAGNOSIS — S62306D Unspecified fracture of fifth metacarpal bone, right hand, subsequent encounter for fracture with routine healing: Secondary | ICD-10-CM
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
## 2014-09-22 NOTE — Telephone Encounter (Signed)
Tracy MechBobby York [phone] ext [phone]  Tracy York called, he seemed upset and he was wanting to know why someone had called his pharmacy and told them not to fill his alprazolam Prudy Feeler(XANAX) 2 MG tablet, he stated that he had one more refill. I called the pharmacy and they had a note in his file not to fill any more medicine until they heard from doctor, but they did not know who called.
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
## 2014-09-22 NOTE — ED Provider Notes (Signed)
Pt well appearing, splinted here in the ED and advised need for followup with hand surgery   Joya Gaskinsonald W Dianne Whelchel, MD 09/22/14 1336
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
## 2014-09-22 NOTE — Telephone Encounter (Signed)
Caller name: Reita ClicheBobby Relation to pt: Call back number: [phone]  Ext [phone]   Reason for call:  Pt wants refill on Rx oxyCODONE-acetaminophen (PERCOCET/ROXICET) 5-325 MG per tablet
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
## 2014-09-22 NOTE — ED Notes (Signed)
Pt left without his d/c instructions, Dr. Bebe ShaggyWickline notified

## 2014-09-23 ENCOUNTER — Encounter: Payer: Self-pay | Admitting: Physician Assistant

## 2014-09-23 NOTE — ED Provider Notes (Signed)
Medical screening examination/treatment/procedure(s) were conducted as a shared visit with non-physician practitioner(s) and myself.  I personally evaluated the patient during the encounter.   EKG Interpretation None       Fracture has been stabilized He has been referred to orthopedics He has been given pain control Pt appropriate/stable for discharge home  Joya Gaskinsonald W Manaia Samad, MD 09/23/14 [phone]

## 2014-09-23 NOTE — Telephone Encounter (Signed)
SEE EXISTING PHONE  NOTE REGARDING THIS MATTER; THIS NOTE HAS BEEN COPY & PASTED INTO ORIGINAL/sls
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
## 2014-09-23 NOTE — Telephone Encounter (Signed)
Attempt to reach pt via phone, No answer, no answering machine [this [facility] is [facility] and Extention is Room number], will attempt to reach patient later regarding this matter/SLS

## 2014-09-24 ENCOUNTER — Telehealth: Payer: Self-pay | Admitting: Physician Assistant

## 2014-09-24 NOTE — Telephone Encounter (Signed)
Dismissal Letter sent by Certified Mail 09/24/2014  Dismissal Letter returned Unclaimed 10/15/2014  Dismissal Letter sent by [employer] Mail 10/16/2014  Dismissal Letter returned again someone opened up [employer] Mail and returned the Dismissal Letter 12/21/2014

## 2014-09-28 ENCOUNTER — Emergency Department (HOSPITAL_COMMUNITY): Payer: Self-pay

## 2014-09-28 ENCOUNTER — Encounter (HOSPITAL_COMMUNITY): Payer: Self-pay | Admitting: Emergency Medicine

## 2014-09-28 ENCOUNTER — Emergency Department (HOSPITAL_COMMUNITY)
Admission: EM | Admit: 2014-09-28 | Discharge: 2014-09-28 | Disposition: A | Discharge status: Home / Self Care | Payer: Self-pay | Attending: Emergency Medicine | Admitting: Emergency Medicine

## 2014-09-28 DIAGNOSIS — S91332A Puncture wound without foreign body, left foot, initial encounter: Secondary | ICD-10-CM

## 2014-09-28 DIAGNOSIS — S0102XA Laceration with foreign body of scalp, initial encounter: Secondary | ICD-10-CM

## 2014-09-28 DIAGNOSIS — S90852A Superficial foreign body, left foot, initial encounter: Secondary | ICD-10-CM

## 2014-09-28 DIAGNOSIS — Z8619 Personal history of other infectious and parasitic diseases: Secondary | ICD-10-CM | POA: Insufficient documentation

## 2014-09-28 DIAGNOSIS — S62306D Unspecified fracture of fifth metacarpal bone, right hand, subsequent encounter for fracture with routine healing: Secondary | ICD-10-CM

## 2014-09-28 DIAGNOSIS — M795 Residual foreign body in soft tissue: Secondary | ICD-10-CM

## 2014-09-28 DIAGNOSIS — S60221A Contusion of right hand, initial encounter: Secondary | ICD-10-CM | POA: Insufficient documentation

## 2014-09-28 DIAGNOSIS — I10 Essential (primary) hypertension: Secondary | ICD-10-CM | POA: Insufficient documentation

## 2014-09-28 DIAGNOSIS — S0990XA Unspecified injury of head, initial encounter: Secondary | ICD-10-CM | POA: Insufficient documentation

## 2014-09-28 DIAGNOSIS — S91342A Puncture wound with foreign body, left foot, initial encounter: Secondary | ICD-10-CM | POA: Insufficient documentation

## 2014-09-28 DIAGNOSIS — Z79899 Other long term (current) drug therapy: Secondary | ICD-10-CM | POA: Insufficient documentation

## 2014-09-28 DIAGNOSIS — Z720 Tobacco use: Secondary | ICD-10-CM | POA: Insufficient documentation

## 2014-09-28 DIAGNOSIS — Z8719 Personal history of other diseases of the digestive system: Secondary | ICD-10-CM | POA: Insufficient documentation

## 2014-09-28 DIAGNOSIS — S0181XA Laceration without foreign body of other part of head, initial encounter: Secondary | ICD-10-CM | POA: Insufficient documentation

## 2014-09-28 DIAGNOSIS — Z23 Encounter for immunization: Secondary | ICD-10-CM | POA: Insufficient documentation

## 2014-09-28 MED ORDER — TETRACAINE HCL 0.5 % OP SOLN
2.0000 [drp] | Freq: Once | OPHTHALMIC | Status: AC
  Administered 2014-09-28: 2 [drp] via OPHTHALMIC

## 2014-09-28 MED ORDER — POVIDONE-IODINE 10 % EX SOLN
CUTANEOUS | Status: AC
  Administered 2014-09-28: 04:00:00
  Filled 2014-09-28: qty 118

## 2014-09-28 MED ORDER — TETANUS-DIPHTH-ACELL PERTUSSIS 5-2.5-18.5 LF-MCG/0.5 IM SUSP
0.5000 mL | Freq: Once | INTRAMUSCULAR | Status: AC
  Administered 2014-09-28: 0.5 mL via INTRAMUSCULAR
  Filled 2014-09-28: qty 0.5

## 2014-09-28 MED ORDER — LIDOCAINE-EPINEPHRINE (PF) 1 %-1:200000 IJ SOLN
10.0000 mL | Freq: Once | INTRAMUSCULAR | Status: AC
  Administered 2014-09-28: 10 mL
  Filled 2014-09-28: qty 10

## 2014-09-28 MED ORDER — OXYCODONE-ACETAMINOPHEN 5-325 MG PO TABS
1.0000 | ORAL_TABLET | Freq: Once | ORAL | Status: AC
  Administered 2014-09-28: 1 via ORAL
  Filled 2014-09-28: qty 1

## 2014-09-28 MED ORDER — TETRACAINE HCL 0.5 % OP SOLN
OPHTHALMIC | Status: AC
  Administered 2014-09-28: 2 [drp] via OPHTHALMIC
  Filled 2014-09-28: qty 2

## 2014-09-28 MED ORDER — FLUORESCEIN SODIUM 1 MG OP STRP
ORAL_STRIP | OPHTHALMIC | Status: AC
  Administered 2014-09-28: 1 via OPHTHALMIC
  Filled 2014-09-28: qty 1

## 2014-09-28 MED ORDER — FLUORESCEIN SODIUM 1 MG OP STRP
1.0000 | ORAL_STRIP | Freq: Once | OPHTHALMIC | Status: AC
  Administered 2014-09-28: 1 via OPHTHALMIC

## 2014-09-28 NOTE — ED Notes (Signed)
Discharge instructions given and reviewed with patient.  Patient verbalized understanding to follow up with orthopedics and to keep wounds clean and dry.  Patient ambulatory; discharged home in good condition.
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
## 2014-09-28 NOTE — Discharge Instructions (Signed)
Cast or Splint Care °Casts and splints support injured limbs and keep bones from moving while they heal. It is important to care for your cast or splint at home.   °HOME CARE INSTRUCTIONS °· Keep the cast or splint uncovered during the drying period. It can take 24 to 48 hours to dry if it is made of plaster. A fiberglass cast will dry in less than 1 hour. °· Do not rest the cast on anything harder than a pillow for the first 24 hours. °· Do not put weight on your injured limb or apply pressure to the cast until your health care provider gives you permission. °· Keep the cast or splint dry. Wet casts or splints can lose their shape and may not support the limb as well. A wet cast that has lost its shape can also create harmful pressure on your skin when it dries. Also, wet skin can become infected. °¨ Cover the cast or splint with a plastic bag when bathing or when out in the rain or snow. If the cast is on the trunk of the body, take sponge baths until the cast is removed. °¨ If your cast does become wet, dry it with a towel or a blow dryer on the cool setting only. °· Keep your cast or splint clean. Soiled casts may be wiped with a moistened cloth. °· Do not place any hard or soft foreign objects under your cast or splint, such as cotton, toilet paper, lotion, or powder. °· Do not try to scratch the skin under the cast with any object. The object could get stuck inside the cast. Also, scratching could lead to an infection. If itching is a problem, use a blow dryer on a cool setting to relieve discomfort. °· Do not trim or cut your cast or remove padding from inside of it. °· Exercise all joints next to the injury that are not immobilized by the cast or splint. For example, if you have a long leg cast, exercise the hip joint and toes. If you have an arm cast or splint, exercise the shoulder, elbow, thumb, and fingers. °· Elevate your injured arm or leg on 1 or 2 pillows for the first 1 to 3 days to decrease  swelling and pain. It is best if you can comfortably elevate your cast so it is higher than your heart. °SEEK MEDICAL CARE IF:  °· Your cast or splint cracks. °· Your cast or splint is too tight or too loose. °· You have unbearable itching inside the cast. °· Your cast becomes wet or develops a soft spot or area. °· You have a bad smell coming from inside your cast. °· You get an object stuck under your cast. °· Your skin around the cast becomes red or raw. °· You have new pain or worsening pain after the cast has been applied. °SEEK IMMEDIATE MEDICAL CARE IF:  °· You have fluid leaking through the cast. °· You are unable to move your fingers or toes. °· You have discolored (blue or white), cool, painful, or very swollen fingers or toes beyond the cast. °· You have tingling or numbness around the injured area. °· You have severe pain or pressure under the cast. °· You have any difficulty with your breathing or have shortness of breath. °· You have chest pain. °Document Released: 11/17/2000 Document Revised: 09/10/2013 Document Reviewed: 05/29/2013 °[employer]® Patient Information ©2015 [employer]. This information is not intended to replace advice given to you by your health care   provider. Make sure you discuss any questions you have with your health care provider.   You have had a head injury which does not appear to require admission at this time. A concussion is a state of changed mental ability from trauma.  SEEK IMMEDIATE MEDICAL ATTENTION IF: There is confusion or drowsiness (although children frequently become drowsy after injury).  You cannot awaken the injured person.  There is nausea (feeling sick to your stomach) or continued, forceful vomiting.  You notice dizziness or unsteadiness which is getting worse, or inability to walk.  You have convulsions or unconsciousness.  You experience severe, persistent headaches not relieved by Tylenol. (Do not take aspirin as this impairs clotting  abilities). Take other pain medications only as directed.  You cannot use arms or legs normally.  There are changes in pupil sizes. (This is the black center in the colored part of the eye)  There is clear or bloody discharge from the nose or ears.  Change in speech, vision, swallowing, or understanding.  Localized weakness, numbness, tingling, or change in bowel or bladder control.
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
## 2014-10-02 NOTE — Telephone Encounter (Signed)
Patient informed, understood & agreed; replied "Ok, thank you", provider informed/SLS
